# Patient Record
Sex: Female | Born: 1990 | Race: Black or African American | Hispanic: No | Marital: Single | Smoking: Never smoker
Health system: Southern US, Community
[De-identification: ages and names within clinical notes are randomized; demographics above are authoritative.]

## PROBLEM LIST (undated history)

## (undated) ENCOUNTER — Inpatient Hospital Stay (HOSPITAL_COMMUNITY): Payer: Self-pay

## (undated) DIAGNOSIS — Z789 Other specified health status: Secondary | ICD-10-CM

## (undated) HISTORY — PX: TONSILLECTOMY: SUR1361

## (undated) HISTORY — PX: ADENOIDECTOMY: SUR15

---
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
## 2012-01-20 ENCOUNTER — Encounter (HOSPITAL_COMMUNITY): Payer: Self-pay | Admitting: Emergency Medicine

## 2012-01-20 ENCOUNTER — Emergency Department (INDEPENDENT_AMBULATORY_CARE_PROVIDER_SITE_OTHER)
Admission: EM | Admit: 2012-01-20 | Discharge: 2012-01-20 | Disposition: A | Discharge status: Home / Self Care | Payer: Medicaid Other | Source: Home / Self Care

## 2012-01-20 DIAGNOSIS — J069 Acute upper respiratory infection, unspecified: Secondary | ICD-10-CM

## 2012-01-20 DIAGNOSIS — L03019 Cellulitis of unspecified finger: Secondary | ICD-10-CM

## 2012-01-20 DIAGNOSIS — L03011 Cellulitis of right finger: Secondary | ICD-10-CM

## 2012-01-20 MED ORDER — CEPHALEXIN 500 MG PO CAPS: 500.0000 mg | ORAL_CAPSULE | Freq: Four times a day (QID) | ORAL | Status: DC

## 2012-01-20 NOTE — ED Provider Notes (Signed)
History     CSN: [id number]  Arrival date & time 01/20/12  1631   None     Chief Complaint  Patient presents with  . Hand Pain    (Consider location/radiation/quality/duration/timing/severity/associated sxs/prior treatment) HPI Comments: -21 year-old female presents with pain in her right little finger that started 2-3 days ago. She has noticed swelling in the finger however pain tenderness and redness is around the fingernail.  The second complaint is that of upper respiratory infection symptoms. She is complaining of malaise, nasal stuffiness, headache, PND and scratchy throat.    Patient is a 21 y.o. female presenting with hand pain.  Hand Pain    History reviewed. No pertinent past medical history.  Past Surgical History  Procedure Date  . Tonsillectomy     No family history on file.  History  Substance Use Topics  . Smoking status: Passive Smoke Exposure - Never Smoker  . Smokeless tobacco: Not on file  . Alcohol Use: No    OB History    Grav Para Term Preterm Abortions TAB SAB Ect Mult Living                  Review of Systems  Constitutional: Negative for fever, chills, activity change, appetite change and fatigue.  HENT: Positive for congestion, sore throat, rhinorrhea and postnasal drip. Negative for facial swelling, neck pain and neck stiffness.   Eyes: Negative.   Respiratory: Negative.   Cardiovascular: Negative.   Skin: Negative for pallor and rash.  Neurological: Negative.     Allergies  Review of patient's allergies indicates no known allergies.  Home Medications   Current Outpatient Rx  Name Route Sig Dispense Refill  . NAPROXEN PO Oral Take by mouth.    . CEPHALEXIN 500 MG PO CAPS Oral Take 1 capsule (500 mg total) by mouth 4 (four) times daily. X 7 days 28 capsule 0  . PHENYLEPH-DOXYLAMINE-DM-APAP 5-6.25-10-325 MG/15ML PO LIQD Oral Take 30 mLs by mouth every 4 (four) hours as needed. For cold        BP 122/88  Pulse 93  Temp  99.7 F (37.6 C) (Oral)  Resp 12  SpO2 95%  LMP 01/06/2012  Physical Exam  Constitutional: She is oriented to person, place, and time. She appears well-developed and well-nourished. No distress.  HENT:  Right Ear: External ear normal.  Left Ear: External ear normal.  Mouth/Throat: No oropharyngeal exudate.       OP with minor erythema and clear PND  Eyes: Conjunctivae normal and EOM are normal.  Neck: Normal range of motion. Neck supple.  Cardiovascular: Normal rate and regular rhythm.   Pulmonary/Chest: Effort normal and breath sounds normal. No respiratory distress. She has no wheezes.  Musculoskeletal: Normal range of motion. She exhibits no edema.  Lymphadenopathy:    She has no cervical adenopathy.  Neurological: She is alert and oriented to person, place, and time.  Skin: Skin is warm and dry. No rash noted.       Erythema and tenderness along the edge of the nail of the right fifth digit. There is minimal puffiness and does not appear ready or amenable to I&D at this time.  Psychiatric: She has a normal mood and affect.    ED Course  Procedures (including critical care time)  Labs Reviewed - No data to display No results found.   1. Paronychia of fifth finger, right   2. URI (upper respiratory infection)       MDM  Keflex  500 qid for 7 d. Soak R little finger in warm salt water at least tid. For infection worsening return May take Nyquil or Dayquil for URI sx's.  Plenty of fluids. Tylenol q 4h prn .         Hayden Rasmussen, NP 01/20/12 1912

## 2012-01-20 NOTE — ED Notes (Signed)
Patient noticed right little finger swelling, now says finger is turning green.  Visible pus around nail bed at cuticle.  Also c/o cold symptoms: runny nose, stuffy sinus, coughing, sneezing and headache.

## 2012-01-20 NOTE — ED Notes (Signed)
Immunizations are current 

## 2012-01-21 NOTE — ED Provider Notes (Signed)
Medical screening examination/treatment/procedure(s) were performed by resident physician or non-physician practitioner and as supervising physician I was immediately available for consultation/collaboration.   KINDL,JAMES DOUGLAS MD.    James D Kindl, MD 01/21/12 2102 

## 2012-08-30 ENCOUNTER — Encounter (HOSPITAL_COMMUNITY): Payer: Self-pay | Admitting: *Deleted

## 2012-08-30 ENCOUNTER — Inpatient Hospital Stay (HOSPITAL_COMMUNITY)
Admission: AD | Admit: 2012-08-30 | Discharge: 2012-08-31 | Disposition: A | Discharge status: Home / Self Care | Payer: Medicaid Other | Source: Ambulatory Visit | Attending: Obstetrics & Gynecology | Admitting: Obstetrics & Gynecology

## 2012-08-30 DIAGNOSIS — R1032 Left lower quadrant pain: Secondary | ICD-10-CM | POA: Insufficient documentation

## 2012-08-30 DIAGNOSIS — M7918 Myalgia, other site: Secondary | ICD-10-CM

## 2012-08-30 DIAGNOSIS — Z3202 Encounter for pregnancy test, result negative: Secondary | ICD-10-CM | POA: Insufficient documentation

## 2012-08-30 DIAGNOSIS — N912 Amenorrhea, unspecified: Secondary | ICD-10-CM | POA: Insufficient documentation

## 2012-08-30 DIAGNOSIS — N911 Secondary amenorrhea: Secondary | ICD-10-CM

## 2012-08-30 HISTORY — DX: Other specified health status: Z78.9

## 2012-08-30 LAB — URINE MICROSCOPIC-ADD ON

## 2012-08-30 LAB — URINALYSIS, ROUTINE W REFLEX MICROSCOPIC
Bilirubin Urine: NEGATIVE
Ketones, ur: NEGATIVE mg/dL
Nitrite: NEGATIVE
Protein, ur: NEGATIVE mg/dL
pH: 6 (ref 5.0–8.0)

## 2012-08-30 NOTE — MAU Note (Signed)
Pt LMP 07/07/2012, neg UPT at home.  Having cramping x 2 wks greater on left side.

## 2012-08-30 NOTE — MAU Provider Note (Signed)
Chief Complaint: Possible Pregnancy  First Provider Initiated Contact with Patient 08/30/12 2355     SUBJECTIVE HPI: Alice Crawford is a 22 y.o. G0P0 female who presents with left flank pain radiating to left lower quadrant x 2 weeks. LMP 07/07/2012. Negative home UPT. Patient states she usually has monthly cycles. Wants to know if she is pregnant.   Describes pain as constant, but worse when lying down. 5/10 on pain scale at worst. Minimal pain now. Has not tried anything for the pain. Denies fever, chills, urinary complaints, hematuria, vaginal bleeding, vaginal discharge, GI complaints. Patient states she is sexually active, not using birth control, but does not desire pregnancy.  Past Medical History  Diagnosis Date  . Medical history non-contributory    OB History   Grav Para Term Preterm Abortions TAB SAB Ect Mult Living   0              Past Surgical History  Procedure Laterality Date  . Tonsillectomy     History   Social History  . Marital Status: Single    Spouse Name: N/A    Number of Children: N/A  . Years of Education: N/A   Occupational History  . Not on file.   Social History Main Topics  . Smoking status: Current Some Day Smoker    Types: Cigars  . Smokeless tobacco: Not on file  . Alcohol Use: No  . Drug Use: No  . Sexually Active: Yes    Birth Control/ Protection: None   Other Topics Concern  . Not on file   Social History Narrative  . No narrative on file   No current facility-administered medications on file prior to encounter.   Current Outpatient Prescriptions on File Prior to Encounter  Medication Sig Dispense Refill  . Phenyleph-Doxylamine-DM-APAP (TYLENOL COLD MULTI-SYMPTOM) 5-6.25-10-325 MG/15ML LIQD Take 30 mLs by mouth every 4 (four) hours as needed. For cold         No Known Allergies  ROS: Pertinent items in HPI  OBJECTIVE Blood pressure 132/80, pulse 106, temperature 98.2 F (36.8 C), temperature source Oral, resp. rate 18,  height 5\' 8"  (1.727 m), weight 135.716 kg (299 lb 3.2 oz), last menstrual period 07/07/2012.  GENERAL: Well-developed, well-nourished female in no acute distress.  HEENT: Normocephalic HEART: Mild tachycardia. Regular rhythm. RESP: normal effort ABDOMEN: Soft, mild left lower quadrant tenderness. No rebound or mass. BACK: No CVA tenderness. NEURO: Alert and oriented SPECULUM EXAM: NEFG, physiologic discharge, no odor, no blood noted, cervix clean BIMANUAL: cervix closed; uterus normal size, no adnexal tenderness or masses. No CMT.  LAB RESULTS Results for orders placed during the hospital encounter of 08/30/12 (from the past 24 hour(s))  URINALYSIS, ROUTINE W REFLEX MICROSCOPIC     Status: Abnormal   Collection Time    08/30/12 11:07 PM      Result Value Range   Color, Urine YELLOW  YELLOW   APPearance CLEAR  CLEAR   Specific Gravity, Urine 1.015  1.005 - 1.030   pH 6.0  5.0 - 8.0   Glucose, UA NEGATIVE  NEGATIVE mg/dL   Hgb urine dipstick NEGATIVE  NEGATIVE   Bilirubin Urine NEGATIVE  NEGATIVE   Ketones, ur NEGATIVE  NEGATIVE mg/dL   Protein, ur NEGATIVE  NEGATIVE mg/dL   Urobilinogen, UA 1.0  0.0 - 1.0 mg/dL   Nitrite NEGATIVE  NEGATIVE   Leukocytes, UA SMALL (*) NEGATIVE  URINE MICROSCOPIC-ADD ON     Status: Abnormal   Collection Time  08/30/12 11:07 PM      Result Value Range   Squamous Epithelial / LPF FEW (*) RARE   WBC, UA 3-6  <3 WBC/hpf   Bacteria, UA FEW (*) RARE  POCT PREGNANCY, URINE     Status: None   Collection Time    08/30/12 11:25 PM      Result Value Range   Preg Test, Ur NEGATIVE  NEGATIVE  WET PREP, GENITAL     Status: Abnormal   Collection Time    08/31/12 12:40 AM      Result Value Range   Yeast Wet Prep HPF POC NONE SEEN  NONE SEEN   Trich, Wet Prep NONE SEEN  NONE SEEN   Clue Cells Wet Prep HPF POC FEW (*) NONE SEEN   WBC, Wet Prep HPF POC FEW (*) NONE SEEN  CBC     Status: Abnormal   Collection Time    08/31/12 12:55 AM      Result Value  Range   WBC 7.2  4.0 - 10.5 K/uL   RBC 4.99  3.87 - 5.11 MIL/uL   Hemoglobin 11.3 (*) 12.0 - 15.0 g/dL   HCT 14.7 (*) 82.9 - 56.2 %   MCV 69.1 (*) 78.0 - 100.0 fL   MCH 22.6 (*) 26.0 - 34.0 pg   MCHC 32.8  30.0 - 36.0 g/dL   RDW 13.0 (*) 86.5 - 78.4 %   Platelets 393  150 - 400 K/uL    IMAGING No results found.  MAU COURSE  ASSESSMENT 1. Musculoskeletal pain   2. Secondary amenorrhea    PLAN Discharge home in stable condition. Comfort measures. GC/CT pending. Strongly encourage condom use.     Follow-up Information   Follow up with Primary care provider. (As needed if symptoms worsen)       Follow up with Gynecologist. (As needed if you do not get your period in one month.)        Medication List    STOP taking these medications       cephALEXin 500 MG capsule  Commonly known as:  KEFLEX     NAPROXEN PO      TAKE these medications       ibuprofen 600 MG tablet  Commonly known as:  ADVIL,MOTRIN  Take 1 tablet (600 mg total) by mouth every 6 (six) hours as needed for pain.     TYLENOL COLD MULTI-SYMPTOM 5-6.25-10-325 MG/15ML Liqd  Generic drug:  Phenyleph-Doxylamine-DM-APAP  Take 30 mLs by mouth every 4 (four) hours as needed. For cold         [place], [place] 08/31/2012  1:34 AM

## 2012-08-31 DIAGNOSIS — IMO0001 Reserved for inherently not codable concepts without codable children: Secondary | ICD-10-CM

## 2012-08-31 LAB — CBC
Platelets: 393 10*3/uL (ref 150–400)
RBC: 4.99 MIL/uL (ref 3.87–5.11)
WBC: 7.2 10*3/uL (ref 4.0–10.5)

## 2012-08-31 LAB — WET PREP, GENITAL

## 2012-08-31 MED ORDER — IBUPROFEN 600 MG PO TABS: 600.0000 mg | ORAL_TABLET | Freq: Four times a day (QID) | ORAL | Status: DC | PRN

## 2012-08-31 NOTE — MAU Provider Note (Signed)
Attestation of Attending Supervision of Advanced Practitioner (CNM/NP): Evaluation and management procedures were performed by the Advanced Practitioner under my supervision and collaboration. I have reviewed the Advanced Practitioner's note and chart, and I agree with the management and plan.  Yarieliz Wasser H. 5:57 AM   

## 2012-09-01 LAB — URINE CULTURE

## 2012-09-01 LAB — GC/CHLAMYDIA PROBE AMP: CT Probe RNA: NEGATIVE

## 2012-09-02 ENCOUNTER — Encounter (HOSPITAL_COMMUNITY): Payer: Self-pay | Admitting: *Deleted

## 2014-06-20 ENCOUNTER — Emergency Department (HOSPITAL_COMMUNITY)
Admission: EM | Admit: 2014-06-20 | Discharge: 2014-06-20 | Disposition: A | Discharge status: Home / Self Care | Payer: PRIVATE HEALTH INSURANCE | Attending: Emergency Medicine | Admitting: Emergency Medicine

## 2014-06-20 ENCOUNTER — Emergency Department (HOSPITAL_COMMUNITY): Payer: PRIVATE HEALTH INSURANCE

## 2014-06-20 ENCOUNTER — Encounter (HOSPITAL_COMMUNITY): Payer: Self-pay | Admitting: Emergency Medicine

## 2014-06-20 DIAGNOSIS — R55 Syncope and collapse: Secondary | ICD-10-CM | POA: Insufficient documentation

## 2014-06-20 DIAGNOSIS — X58XXXA Exposure to other specified factors, initial encounter: Secondary | ICD-10-CM | POA: Diagnosis not present

## 2014-06-20 DIAGNOSIS — S0990XA Unspecified injury of head, initial encounter: Secondary | ICD-10-CM | POA: Insufficient documentation

## 2014-06-20 DIAGNOSIS — S025XXA Fracture of tooth (traumatic), initial encounter for closed fracture: Secondary | ICD-10-CM | POA: Insufficient documentation

## 2014-06-20 DIAGNOSIS — Y998 Other external cause status: Secondary | ICD-10-CM | POA: Diagnosis not present

## 2014-06-20 DIAGNOSIS — E669 Obesity, unspecified: Secondary | ICD-10-CM | POA: Diagnosis not present

## 2014-06-20 DIAGNOSIS — Y9289 Other specified places as the place of occurrence of the external cause: Secondary | ICD-10-CM | POA: Insufficient documentation

## 2014-06-20 DIAGNOSIS — S59901A Unspecified injury of right elbow, initial encounter: Secondary | ICD-10-CM | POA: Diagnosis not present

## 2014-06-20 DIAGNOSIS — Y9389 Activity, other specified: Secondary | ICD-10-CM | POA: Diagnosis not present

## 2014-06-20 DIAGNOSIS — Z720 Tobacco use: Secondary | ICD-10-CM | POA: Insufficient documentation

## 2014-06-20 LAB — BASIC METABOLIC PANEL
ANION GAP: 10 (ref 5–15)
BUN: 8 mg/dL (ref 6–23)
CO2: 25 mmol/L (ref 19–32)
Calcium: 8.9 mg/dL (ref 8.4–10.5)
Chloride: 103 mmol/L (ref 96–112)
Creatinine, Ser: 0.66 mg/dL (ref 0.50–1.10)
GFR calc Af Amer: >90 mL/min (ref >90)
GLUCOSE: 104 mg/dL — AB (ref 70–99)
Potassium: 3.7 mmol/L (ref 3.5–5.1)
Sodium: 138 mmol/L (ref 135–145)

## 2014-06-20 LAB — POC URINE PREG, ED: PREG TEST UR: NEGATIVE

## 2014-06-20 LAB — CBC
HCT: 35.3 % — ABNORMAL LOW (ref 36.0–46.0)
Hemoglobin: 11.2 g/dL — ABNORMAL LOW (ref 12.0–15.0)
MCH: 22.1 pg — ABNORMAL LOW (ref 26.0–34.0)
MCHC: 31.7 g/dL (ref 30.0–36.0)
MCV: 69.8 fL — ABNORMAL LOW (ref 78.0–100.0)
Platelets: 392 10*3/uL (ref 150–400)
RBC: 5.06 MIL/uL (ref 3.87–5.11)
RDW: 17.2 % — AB (ref 11.5–15.5)
WBC: 6.6 10*3/uL (ref 4.0–10.5)

## 2014-06-20 LAB — CBG MONITORING, ED: GLUCOSE-CAPILLARY: 105 mg/dL — AB (ref 70–99)

## 2014-06-20 MED ORDER — PENICILLIN V POTASSIUM 500 MG PO TABS: 500.0000 mg | ORAL_TABLET | Freq: Three times a day (TID) | ORAL | Status: DC

## 2014-06-20 MED ORDER — OXYCODONE-ACETAMINOPHEN 5-325 MG PO TABS
1.0000 | ORAL_TABLET | Freq: Four times a day (QID) | ORAL | Status: DC | PRN
Start: 2014-06-20 — End: 2015-07-09

## 2014-06-20 MED ORDER — OXYCODONE-ACETAMINOPHEN 5-325 MG PO TABS
1.0000 | ORAL_TABLET | Freq: Once | ORAL | Status: AC
  Administered 2014-06-20: 1 via ORAL
  Filled 2014-06-20: qty 1

## 2014-06-20 MED ORDER — ACETAMINOPHEN 325 MG PO TABS
650.0000 mg | ORAL_TABLET | Freq: Once | ORAL | Status: AC
  Administered 2014-06-20: 650 mg via ORAL
  Filled 2014-06-20: qty 2

## 2014-06-20 NOTE — ED Notes (Signed)
Brought in by EMS from [employer] after her syncopal episode.  Pt reported that she felt dizzy and nauseous while shopping and then passed out; awake on EMS' arrival at the scene.  Pt can not remember falling.  Right elbow pain and headache on return of consciousness.  Pt arrived to ED A/Ox4, c/o right elbow pain and headache; denies dizziness and nausea at this time.  LMP 2 weeks ago.  No significant medical hx.

## 2014-06-20 NOTE — ED Notes (Signed)
Pt was given soda (sprite) for fluid challenge.

## 2014-06-20 NOTE — ED Notes (Signed)
Pt tolerated sprite well--- pt denies nausea.

## 2014-06-20 NOTE — ED Provider Notes (Signed)
CSN: [id number]     Arrival date & time 06/20/14  1618 History   First MD Initiated Contact with Patient 06/20/14 1740     Chief Complaint  Patient presents with  . Loss of Consciousness     (Consider location/radiation/quality/duration/timing/severity/associated sxs/prior Treatment) Patient is a 24 y.o. female presenting with syncope. The history is provided by the patient.  Loss of Consciousness Associated symptoms: headaches and nausea   Associated symptoms: no chest pain, no shortness of breath, no vomiting and no weakness    patient [place] [employer] [place] and [place]. 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 She's had a slight headache since. Some mild pain or elbow 2. States she was somewhat confused why people around her. No chest pain. No trouble breathing. States she was nauseous with for the event. She states all she seems take some crackers. No diarrhea. She denies possibility of pregnancy. No episodes like this before. No chest pain. She occasionally is a smoker.  Past Medical History  Diagnosis Date  . Medical history non-contributory    Past Surgical History  Procedure Laterality Date  . Tonsillectomy     Family History  Problem Relation Age of Onset  . Hypertension Mother    History  Substance Use Topics  . Smoking status: Current Some Day Smoker    Types: Cigars  . Smokeless tobacco: Not on file  . Alcohol Use: No   OB History    Gravida Para Term Preterm AB TAB SAB Ectopic Multiple Living   0              Review of Systems  Constitutional: Negative for activity change and appetite change.  Eyes: Negative for pain.  Respiratory: Negative for chest tightness and shortness of breath.   Cardiovascular: Positive for syncope. Negative for chest pain and leg swelling.  Gastrointestinal: Positive for nausea. Negative for vomiting, abdominal pain and diarrhea.  Genitourinary:  Negative for flank pain.  Musculoskeletal: Negative for back pain and neck stiffness.  Skin: Negative for rash.  Neurological: Positive for syncope and headaches. Negative for weakness and numbness.  Psychiatric/Behavioral: Negative for behavioral problems.      Allergies  Review of patient's allergies indicates no known allergies.  Home Medications   Prior to Admission medications   Medication Sig Start Date End Date Taking? Authorizing Provider  Pseudoeph-Doxylamine-DM-APAP (NYQUIL PO) Take 5 mLs by mouth once.   Yes Historical Provider, MD  ibuprofen (ADVIL,MOTRIN) 600 MG tablet Take 1 tablet (600 mg total) by mouth every 6 (six) hours as needed for pain. Patient not taking: Reported on 06/20/2014 08/31/12   Dorathy KinsmanVirginia Windt, CNM  oxyCODONE-acetaminophen (PERCOCET/ROXICET) 5-325 MG per tablet Take 1-2 tablets by mouth every 6 (six) hours as needed for severe pain. 06/20/14   Benjiman CoreNathan Tyiana Hill, MD  penicillin v potassium (VEETID) 500 MG tablet Take 1 tablet (500 mg total) by mouth 3 (three) times daily. 06/20/14   Benjiman CoreNathan Aleeta Schmaltz, MD   BP 118/74 mmHg  Pulse 80  Temp(Src) 98 F (36.7 C) (Oral)  Resp 18  SpO2 100%  LMP  Physical Exam  Constitutional: She is oriented to person, place, and time. She appears well-developed and well-nourished.  Patient is obese.  HENT:  Head: Normocephalic and atraumatic.  Eyes: EOM are normal. Pupils are equal, round, and reactive to light.  Neck: Normal range of motion. Neck supple.  Cardiovascular: Normal rate, regular rhythm and  normal heart sounds.   No murmur heard. Pulmonary/Chest: Effort normal and breath sounds normal. No respiratory distress. She has no wheezes. She has no rales.  Abdominal: Soft. Bowel sounds are normal. She exhibits no distension. There is no tenderness. There is no rebound and no guarding.  Musculoskeletal: Normal range of motion.  No tenderness to right elbow. Range of motion intact. No tenderness to wrist or shoulder.  Skin intact. Strong radial pulse.  Neurological: She is alert and oriented to person, place, and time. No cranial nerve deficit.  Skin: Skin is warm and dry.  Psychiatric: She has a normal mood and affect. Her speech is normal.  Nursing note and vitals reviewed.   ED Course  Procedures (including critical care time) Labs Review Labs Reviewed  CBC - Abnormal; Notable for the following:    Hemoglobin 11.2 (*)    HCT 35.3 (*)    MCV 69.8 (*)    MCH 22.1 (*)    RDW 17.2 (*)    All other components within normal limits  BASIC METABOLIC PANEL - Abnormal; Notable for the following:    Glucose, Bld 104 (*)    All other components within normal limits  CBG MONITORING, ED - Abnormal; Notable for the following:    Glucose-Capillary 105 (*)    All other components within normal limits  POC URINE PREG, ED    Imaging Review Ct Head Wo Contrast  06/20/2014   CLINICAL DATA:  Syncope, headache  EXAM: CT HEAD WITHOUT CONTRAST  TECHNIQUE: Contiguous axial images were obtained from the base of the skull through the vertex without intravenous contrast.  COMPARISON:  07/10/2006  FINDINGS: No evidence of parenchymal hemorrhage or extra-axial fluid collection. No mass lesion, mass effect, or midline shift.  No CT evidence of acute infarction.  Cerebral volume is within normal limits.  No ventriculomegaly.  The visualized paranasal sinuses are essentially clear. The mastoid air cells are unopacified.  Stable mild chronic irregularity of the left medial orbital wall.  No evidence of calvarial fracture.  IMPRESSION: Normal head CT.   Electronically Signed   By: Charline Bills M.D.   On: 06/20/2014 20:42     EKG Interpretation   Date/Time:  Tuesday June 20 2014 16:46:44 EDT Ventricular Rate:  83 PR Interval:  191 QRS Duration: 92 QT Interval:  369 QTC Calculation: 434 R Axis:   17 Text Interpretation:  Sinus rhythm Confirmed by Rubin Payor  MD, Harrold Donath  [id number]) on 06/20/2014 5:51:51 PM      MDM    Final diagnoses:  Vasovagal syncope  Tooth fracture, closed, initial encounter    Patient with syncopal event. Likely vasovagal. EKG and labs reassuring. Not orthostatic. Doubt pulmonary embolism or other severe cause. Will discharge home.  Patient later told me that she had it on a cracker before she went in to [place]. She states she broke her tooth with it. There is a broken tooth in the left upper lateral jaw. No bleeding. Head CT done due to her severe headache. Will discharge home with antibiotics and pain medicines.  Benjiman Core, MD 06/20/14 2202

## 2014-06-20 NOTE — Discharge Instructions (Signed)
Dental Fracture You have a dental fracture or injury. This can mean the tooth is loose, has a chip in the enamel or is broken. If just the outer enamel is chipped, there is a good chance the tooth will not become infected. The only treatment needed may be to smooth off a rough edge. Fractures into the deeper layers (dentin and pulp) cause greater pain and are more likely to become infected. These require you to see a dentist as soon as possible to save the tooth. Loose teeth may need to be wired or bonded with a plastic splint to hold them in place. A paste may be painted on the open area of the broken tooth to reduce the pain. Antibiotics and pain medicine may be prescribed. Choosing a soft or liquid diet and rinsing the mouth out with warm water after meals may be helpful. See your dentist as recommended. Failure to seek care or follow up with a dentist or other specialist as recommended could result in the loss of your tooth, infection, or permanent dental problems. SEEK MEDICAL CARE IF:   You have increased pain not controlled with medicines.  You have swelling around the tooth, in the face or neck.  You have bleeding which starts, continues, or gets worse.  You have a fever. Document Released: 04/24/2004 Document Revised: 06/09/2011 Document Reviewed: 02/06/2009 [facility] Patient Information 2015 [place]. This information is not intended to replace advice given to you by your health care provider. Make sure you discuss any questions you have with your health care provider.  Vasovagal Syncope, Adult Syncope, commonly known as fainting, is a temporary loss of consciousness. It occurs when the blood flow to the brain is reduced. Vasovagal syncope (also called neurocardiogenic syncope) is a fainting spell in which the blood flow to the brain is reduced because of a sudden drop in heart rate and blood pressure. Vasovagal syncope occurs when the brain and the cardiovascular system (blood  vessels) do not adequately communicate and respond to each other. This is the most common cause of fainting. It often occurs in response to fear or some other type of emotional or physical stress. The body has a reaction in which the heart starts beating too slowly or the blood vessels expand, reducing blood pressure. This type of fainting spell is generally considered harmless. However, injuries can occur if a person takes a sudden fall during a fainting spell.  CAUSES  Vasovagal syncope occurs when a person's blood pressure and heart rate decrease suddenly, usually in response to a trigger. Many things and situations can trigger an episode. Some of these include:   Pain.   Fear.   The sight of blood or medical procedures, such as blood being drawn from a vein.   Common activities, such as coughing, swallowing, stretching, or going to the bathroom.   Emotional stress.   Prolonged standing, especially in a warm environment.   Lack of sleep or rest.   Prolonged lack of food.   Prolonged lack of fluids.   Recent illness.  The use of certain drugs that affect blood pressure, such as cocaine, alcohol, marijuana, inhalants, and opiates.  SYMPTOMS  Before the fainting episode, you may:   Feel dizzy or light headed.   Become pale.  Sense that you are going to faint.   Feel like the room is spinning.   Have tunnel vision, only seeing directly in front of you.   Feel sick to your stomach (nauseous).   See spots  or slowly lose vision.   Hear ringing in your ears.   Have a headache.   Feel warm and sweaty.   Feel a sensation of pins and needles. During the fainting spell, you will generally be unconscious for no longer than a couple minutes before waking up and returning to normal. If you get up too quickly before your body can recover, you may faint again. Some twitching or jerky movements may occur during the fainting spell.  DIAGNOSIS  Your caregiver will  ask about your symptoms, take a medical history, and perform a physical exam. Various tests may be done to rule out other causes of fainting. These may include blood tests and tests to check the heart, such as electrocardiography, echocardiography, and possibly an electrophysiology study. When other causes have been ruled out, a test may be done to check the body's response to changes in position (tilt table test). TREATMENT  Most cases of vasovagal syncope do not require treatment. Your caregiver may recommend ways to avoid fainting triggers and may provide home strategies for preventing fainting. If you must be exposed to a possible trigger, you can drink additional fluids to help reduce your chances of having an episode of vasovagal syncope. If you have warning signs of an oncoming episode, you can respond by positioning yourself favorably (lying down). If your fainting spells continue, you may be given medicines to prevent fainting. Some medicines may help make you more resistant to repeated episodes of vasovagal syncope. Special exercises or compression stockings may be recommended. In rare cases, the surgical placement of a pacemaker is considered. HOME CARE INSTRUCTIONS   Learn to identify the warning signs of vasovagal syncope.   Sit or lie down at the first warning sign of a fainting spell. If sitting, put your head down between your legs. If you lie down, swing your legs up in the air to increase blood flow to the brain.   Avoid hot tubs and saunas.  Avoid prolonged standing.  Drink enough fluids to keep your urine clear or pale yellow. Avoid caffeine.  Increase salt in your diet as directed by your caregiver.   If you have to stand for a long time, perform movements such as:   Crossing your legs.   Flexing and stretching your leg muscles.   Squatting.   Moving your legs.   Bending over.   Only take over-the-counter or prescription medicines as directed by your  caregiver. Do not suddenly stop any medicines without asking your caregiver first. SEEK MEDICAL CARE IF:   Your fainting spells continue or happen more frequently in spite of treatment.   You lose consciousness for more than a couple minutes.  You have fainting spells during or after exercising or after being startled.   You have new symptoms that occur with the fainting spells, such as:   Shortness of breath.  Chest pain.   Irregular heartbeat.   You have episodes of twitching or jerky movements that last longer than a few seconds.  You have episodes of twitching or jerky movements without obvious fainting. SEEK IMMEDIATE MEDICAL CARE IF:   You have injuries or bleeding after a fainting spell.   You have episodes of twitching or jerky movements that last longer than 5 minutes.   You have more than one spell of twitching or jerky movements before returning to consciousness after fainting. MAKE SURE YOU:   Understand these instructions.  Will watch your condition.  Will get help right away if you  are not doing well or get worse. Document Released: 03/03/2012 Document Reviewed: 03/03/2012 [facility] Patient Information 2015 [place], [place]. This information is not intended to replace advice given to you by your health care provider. Make sure you discuss any questions you have with your health care provider.
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
## 2015-04-17 ENCOUNTER — Emergency Department (HOSPITAL_COMMUNITY): Payer: 59

## 2015-04-17 ENCOUNTER — Emergency Department (HOSPITAL_COMMUNITY)
Admission: EM | Admit: 2015-04-17 | Discharge: 2015-04-17 | Disposition: A | Discharge status: Home / Self Care | Payer: 59 | Attending: Emergency Medicine | Admitting: Emergency Medicine

## 2015-04-17 ENCOUNTER — Encounter (HOSPITAL_COMMUNITY): Payer: Self-pay | Admitting: Emergency Medicine

## 2015-04-17 DIAGNOSIS — Y92410 Unspecified street and highway as the place of occurrence of the external cause: Secondary | ICD-10-CM | POA: Diagnosis not present

## 2015-04-17 DIAGNOSIS — Z3A18 18 weeks gestation of pregnancy: Secondary | ICD-10-CM | POA: Diagnosis not present

## 2015-04-17 DIAGNOSIS — O99332 Smoking (tobacco) complicating pregnancy, second trimester: Secondary | ICD-10-CM | POA: Diagnosis not present

## 2015-04-17 DIAGNOSIS — S3991XA Unspecified injury of abdomen, initial encounter: Secondary | ICD-10-CM | POA: Diagnosis not present

## 2015-04-17 DIAGNOSIS — Y998 Other external cause status: Secondary | ICD-10-CM | POA: Insufficient documentation

## 2015-04-17 DIAGNOSIS — O9A212 Injury, poisoning and certain other consequences of external causes complicating pregnancy, second trimester: Secondary | ICD-10-CM | POA: Insufficient documentation

## 2015-04-17 DIAGNOSIS — Z79899 Other long term (current) drug therapy: Secondary | ICD-10-CM | POA: Insufficient documentation

## 2015-04-17 DIAGNOSIS — F17210 Nicotine dependence, cigarettes, uncomplicated: Secondary | ICD-10-CM | POA: Insufficient documentation

## 2015-04-17 DIAGNOSIS — Y9389 Activity, other specified: Secondary | ICD-10-CM | POA: Diagnosis not present

## 2015-04-17 DIAGNOSIS — R1031 Right lower quadrant pain: Secondary | ICD-10-CM

## 2015-04-17 LAB — URINALYSIS, ROUTINE W REFLEX MICROSCOPIC
BILIRUBIN URINE: NEGATIVE
GLUCOSE, UA: NEGATIVE mg/dL
HGB URINE DIPSTICK: NEGATIVE
Ketones, ur: NEGATIVE mg/dL
Leukocytes, UA: NEGATIVE
Nitrite: NEGATIVE
PH: 5.5 (ref 5.0–8.0)
Protein, ur: NEGATIVE mg/dL
SPECIFIC GRAVITY, URINE: 1.015 (ref 1.005–1.030)

## 2015-04-17 LAB — CBC
HEMATOCRIT: 31.1 % — AB (ref 36.0–46.0)
Hemoglobin: 10.5 g/dL — ABNORMAL LOW (ref 12.0–15.0)
MCH: 23.6 pg — AB (ref 26.0–34.0)
MCHC: 33.8 g/dL (ref 30.0–36.0)
MCV: 69.9 fL — AB (ref 78.0–100.0)
Platelets: 329 10*3/uL (ref 150–400)
RBC: 4.45 MIL/uL (ref 3.87–5.11)
RDW: 18.5 % — ABNORMAL HIGH (ref 11.5–15.5)
WBC: 6.1 10*3/uL (ref 4.0–10.5)

## 2015-04-17 LAB — COMPREHENSIVE METABOLIC PANEL
ALK PHOS: 67 U/L (ref 38–126)
ALT: 19 U/L (ref 14–54)
AST: 19 U/L (ref 15–41)
Albumin: 3.2 g/dL — ABNORMAL LOW (ref 3.5–5.0)
Anion gap: 12 (ref 5–15)
BUN: 6 mg/dL (ref 6–20)
CO2: 20 mmol/L — AB (ref 22–32)
CREATININE: 0.58 mg/dL (ref 0.44–1.00)
Calcium: 9.2 mg/dL (ref 8.9–10.3)
Chloride: 106 mmol/L (ref 101–111)
Glucose, Bld: 91 mg/dL (ref 65–99)
Potassium: 4.1 mmol/L (ref 3.5–5.1)
Sodium: 138 mmol/L (ref 135–145)
Total Bilirubin: 0.3 mg/dL (ref 0.3–1.2)
Total Protein: 6.7 g/dL (ref 6.5–8.1)

## 2015-04-17 LAB — I-STAT BETA HCG BLOOD, ED (MC, WL, AP ONLY)

## 2015-04-17 LAB — LIPASE, BLOOD: Lipase: 22 U/L (ref 11–51)

## 2015-04-17 MED ORDER — ACETAMINOPHEN 500 MG PO TABS
1000.0000 mg | ORAL_TABLET | Freq: Once | ORAL | Status: AC
  Administered 2015-04-17: 1000 mg via ORAL
  Filled 2015-04-17: qty 2

## 2015-04-17 NOTE — Discharge Instructions (Signed)

## 2015-04-17 NOTE — ED Notes (Signed)
Tried to get a urine sample from patient. Pt stated she is unable to go at this time. Will try again later.
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
## 2015-07-09 ENCOUNTER — Inpatient Hospital Stay (HOSPITAL_COMMUNITY)
Admission: AD | Admit: 2015-07-09 | Discharge: 2015-07-09 | Disposition: A | Discharge status: Home / Self Care | Payer: Medicaid Other | Source: Ambulatory Visit | Attending: Obstetrics & Gynecology | Admitting: Obstetrics & Gynecology

## 2015-07-09 ENCOUNTER — Encounter (HOSPITAL_COMMUNITY): Payer: Self-pay | Admitting: *Deleted

## 2015-07-09 DIAGNOSIS — O9989 Other specified diseases and conditions complicating pregnancy, childbirth and the puerperium: Secondary | ICD-10-CM | POA: Diagnosis not present

## 2015-07-09 DIAGNOSIS — R0789 Other chest pain: Secondary | ICD-10-CM

## 2015-07-09 DIAGNOSIS — O26893 Other specified pregnancy related conditions, third trimester: Secondary | ICD-10-CM | POA: Insufficient documentation

## 2015-07-09 DIAGNOSIS — K219 Gastro-esophageal reflux disease without esophagitis: Secondary | ICD-10-CM | POA: Diagnosis not present

## 2015-07-09 DIAGNOSIS — R102 Pelvic and perineal pain: Secondary | ICD-10-CM | POA: Insufficient documentation

## 2015-07-09 DIAGNOSIS — O99613 Diseases of the digestive system complicating pregnancy, third trimester: Secondary | ICD-10-CM | POA: Diagnosis not present

## 2015-07-09 DIAGNOSIS — R079 Chest pain, unspecified: Secondary | ICD-10-CM | POA: Diagnosis present

## 2015-07-09 DIAGNOSIS — R3911 Hesitancy of micturition: Secondary | ICD-10-CM

## 2015-07-09 DIAGNOSIS — Z3A30 30 weeks gestation of pregnancy: Secondary | ICD-10-CM | POA: Insufficient documentation

## 2015-07-09 LAB — WET PREP, GENITAL
Clue Cells Wet Prep HPF POC: NONE SEEN
Sperm: NONE SEEN
TRICH WET PREP: NONE SEEN
YEAST WET PREP: NONE SEEN

## 2015-07-09 LAB — URINALYSIS, ROUTINE W REFLEX MICROSCOPIC
Bilirubin Urine: NEGATIVE
Glucose, UA: NEGATIVE mg/dL
HGB URINE DIPSTICK: NEGATIVE
Ketones, ur: NEGATIVE mg/dL
Leukocytes, UA: NEGATIVE
NITRITE: NEGATIVE
PROTEIN: NEGATIVE mg/dL
Specific Gravity, Urine: 1.01 (ref 1.005–1.030)
pH: 5.5 (ref 5.0–8.0)

## 2015-07-09 MED ORDER — GI COCKTAIL ~~LOC~~
30.0000 mL | Freq: Once | ORAL | Status: AC
  Administered 2015-07-09: 30 mL via ORAL
  Filled 2015-07-09: qty 30

## 2015-07-09 NOTE — MAU Provider Note (Signed)
Took over care for this patient from Dr Alice Crawford.  S: patient notes that her sensation of SOB has resolved after GI cocktail.  Has felt baby move since being in MAU.  Continues to have mild centrally located CP.  Notes that she has hesitancy with urination.  No hematuria.  No abnormal vaginal discharge or odors.  No LOF, VB.  O: BP 131/80 mmHg  Pulse 87  Temp(Src) 97.5 F (36.4 C) (Oral)  Resp 20  Ht 5\' 7"  (1.702 m)  Wt 305 lb (138.347 kg)  BMI 47.76 kg/m2  SpO2 100% Gen: awake, alert, obese, gravid female, NAD, mother at bedside HEENT: MMM, EOMI GI: gravid, no TTP to abdomen GU: external vaginal mucosa normal appearing, cervix without punctate lesions, no bleeding from os, scant white milky discharge from os, no cervical motion TTP Dilation: Closed Effacement (%): Thick Cervical Position: Posterior Exam by:: GOTTSCHALK MD  Fetal monitoringBaseline: 145 bpm, Variability: Good {> 6 bpm), Accelerations: Non-reactive but appropriate for gestational age (10x10s present) and Decelerations: Absent Uterine activity: uterine irritability  No results found for this or any previous visit (from the past 24 hour(s)).  A/P:  Alice Crawford is a 25 y.o. G1P0 at [id number]  Other chest pain:  EKG with left axis but otherwise NSR.  No evidence of ischemic processes.  No EKG findings consistent with PE.  Given that she has normal O2 saturations, normal RR and benign lung exam, very low suspicion for pathologic process at this time.  SOB and CP moderately relieved by GI cocktail.  I suspect that there is a component of anxiety/ GERD causing her symptoms.  Discussed EKG results and FH monitoring.  Patient seemed reassured by this. - Recommend TUMS prn symptoms.  Consider Zantac if persistent - If indeed anxiety provoked, OB provider could consider starting SSRI to help control symptoms - Strict return precautions reviewed  Vaginal pain:  GC/CT collected.  Wet prep w/ leuks but otherwise unremarkable.   Also sounds like a component of round ligament pain.  Discussed obtaining a belly sling.    Urinary hesitancy:  UA negative for evidence of infection  Patient discharged home in stable condition.  Recommended that she follow up with her OB provider/ PCP in the next week.  Alice M. Nadine CountsGottschalk, DO PGY-2, [facility]  OB fellow attestation:  I have seen and examined this patient; I agree with above documentation in the residents's note.   Alice Crawford is a 25 y.o. G1P0 reporting chest pain and vaginal pain +FM, denies LOF, VB, contractions, vaginal discharge.  PE: BP 123/51 mmHg  Pulse 90  Temp(Src) 97.5 F (36.4 C) (Oral)  Resp 20  Ht 5\' 7"  (1.702 m)  Wt 305 lb (138.347 kg)  BMI 47.76 kg/m2  SpO2 100% Gen: calm comfortable, NAD Resp: normal effort, no distress Abd: gravid  ROS, labs, PMH reviewed NST reactive   Plan: - d/c home with return precautions - Likely GERD is etiology of sx, per above recommend treatment if sx continue  Attending: Elsie LincolnKelly Leggett MD   Federico FlakeKimberly Crawford Alice Glace, MD 11:54 PM

## 2015-07-09 NOTE — MAU Note (Signed)
Pt reports chest pain ( indicates upper mid area) x 24 hours, reports shortness of breath x 1 hour. Also reports no fetal movement x 2 days. States she is having vaginal discomfort.

## 2015-07-09 NOTE — Discharge Instructions (Signed)
Consider purchasing a prenatal cradle band like this one: [email or web address]/ref=sr_1_2_a_it?ie=UTF8&qid=704-137-7105&sr=8-2&keywords=prenatal+cradle.  This will help with round ligament pain (pain you feel in your vagina and lower pelvis).  You can take TUMS for heartburn.  Follow up with your OB or PCP in the next week.    Round Ligament Pain The round ligament is a cord of muscle and tissue that helps to support the uterus. It can become a source of pain during pregnancy if it becomes stretched or twisted as the baby grows. The pain usually begins in the second trimester of pregnancy, and it can come and go until the baby is delivered. It is not a serious problem, and it does not cause harm to the baby. Round ligament pain is usually a short, sharp, and pinching pain, but it can also be a dull, lingering, and aching pain. The pain is felt in the lower side of the abdomen or in the groin. It usually starts deep in the groin and moves up to the outside of the hip area. Pain can occur with:  A sudden change in position.  Rolling over in bed.  Coughing or sneezing.  Physical activity. HOME CARE INSTRUCTIONS Watch your condition for any changes. Take these steps to help with your pain:  When the pain starts, relax. Then try:  Sitting down.  Flexing your knees up to your abdomen.  Lying on your side with one pillow under your abdomen and another pillow between your legs.  Sitting in a warm bath for 15-20 minutes or until the pain goes away.  Take over-the-counter and prescription medicines only as told by your health care provider.  Move slowly when you sit and stand.  Avoid long walks if they cause pain.  Stop or lessen your physical activities if they cause pain. SEEK MEDICAL CARE IF:  Your pain does not go away with treatment.  You feel pain in your back that you did not have before.  Your medicine is not helping. SEEK IMMEDIATE  MEDICAL CARE IF:  You develop a fever or chills.  You develop uterine contractions.  You develop vaginal bleeding.  You develop nausea or vomiting.  You develop diarrhea.  You have pain when you urinate.   This information is not intended to replace advice given to you by your health care provider. Make sure you discuss any questions you have with your health care provider.   Document Released: 12/25/2007 Document Revised: 06/09/2011 Document Reviewed: 05/24/2014 Else[employer] Interactive Patient Education [employer]

## 2015-07-09 NOTE — MAU Provider Note (Signed)
History     CSN: [id number]  Arrival date and time: 07/09/15 1907   None     No chief complaint on file.  HPI  She has been having constant chest pain since yesterday. Located in the center of her chest. The pain "feels like something is tightening up". The pain does not radiate. Nothing makes the pain better. The pain is worse with deep inspiration, bending forward, and laying down. She has not tried any medications. She has never had pain like this before. The chest pain is associated with shortness of breath. The chest pain is not associated with diaphoresis or nausea.  Vaginal pain that started today. The pain is "sharp". The pain is worse with movement. Nothing makes the pain better. She denies any vaginal discharge, no vaginal itchiness. She endorses dysuria that started this morning. She also endorses urinary frequency and urgency. No fevers, no chills.  She denies any vaginal bleeding. No gush of fluids. She has felt fetal movement.   Past Medical History  Diagnosis Date  . Medical history non-contributory     Past Surgical History  Procedure Laterality Date  . Tonsillectomy    . Adenoidectomy      Family History  Problem Relation Age of Onset  . Hypertension Mother     Social History  Substance Use Topics  . Smoking status: Never Smoker   . Smokeless tobacco: None  . Alcohol Use: No    Allergies: No Known Allergies  Prescriptions prior to admission  Medication Sig Dispense Refill Last Dose  . acetaminophen (TYLENOL) 325 MG tablet Take 325 mg by mouth every 6 (six) hours as needed for headache.   Past Week at Unknown time  . ibuprofen (ADVIL,MOTRIN) 600 MG tablet Take 1 tablet (600 mg total) by mouth every 6 (six) hours as needed for pain. (Patient not taking: Reported on 06/20/2014) 30 tablet 1 Not Taking at Unknown time  . oxyCODONE-acetaminophen (PERCOCET/ROXICET) 5-325 MG per tablet Take 1-2 tablets by mouth every 6 (six) hours as needed for severe pain.  (Patient not taking: Reported on 04/17/2015) 10 tablet 0 Not Taking at Unknown time  . penicillin v potassium (VEETID) 500 MG tablet Take 1 tablet (500 mg total) by mouth 3 (three) times daily. (Patient not taking: Reported on 04/17/2015) 21 tablet 0 Completed Course at Unknown time  . Prenatal Vit-Fe Fumarate-FA (PRENATAL MULTIVITAMIN) TABS tablet Take 1 tablet by mouth daily at 12 noon.   04/17/2015 at Unknown time    Review of Systems  Constitutional: Negative for fever and chills.  Eyes: Negative for blurred vision.  Respiratory: Negative for shortness of breath.   Cardiovascular: Positive for chest pain.  Gastrointestinal: Negative for heartburn, nausea, vomiting and abdominal pain.  Genitourinary: Negative for dysuria.  Musculoskeletal: Negative for myalgias.  Skin: Negative for rash.  Neurological: Negative for dizziness, weakness and headaches.  Endo/Heme/Allergies: Negative for environmental allergies.   Physical Exam   Blood pressure 131/80, pulse 87, temperature 97.5 F (36.4 C), temperature source Oral, resp. rate 20, height  (1.702 m), weight 305 lb (138.347 kg), SpO2 100 %.  Physical Exam  Nursing note and vitals reviewed. Constitutional: She is oriented to person, place, and time. She appears well-developed and well-nourished. No distress.  Eyes: No scleral icterus.  Neck: Normal range of motion.  Cardiovascular: Normal rate.   Tenderness to palpation of the sternum  Respiratory: Effort normal.  GI: Soft. There is no tenderness.  gravid  Musculoskeletal: Normal range of motion.  Neurological: She is alert and oriented to person, place, and time.  Skin: Skin is warm and dry. No rash noted.    MAU Course  Procedures  MDM Pt is having chest pain that is most likely musculoskeletal in origin, given that her pain is reproducible on palpation. She could also have GERD, although she denies any burning in her chest and cough. Her EKG does not show any ST elevation,  so cardiac etiology is less likely.  Assessment and Plan  #Chest Pain: Likely musculoskeletal vs GERD - EKG ordered and did not show any ST elevations or depressions - Will order a GI cocktail to see if this helps her pain  #Vaginal Pain: Likely UTI vs STD vs yeast vs pressure related to pregnancy - UA pending - Will order a wet prep and GC/Chl  Care transferred to oncoming physician, Alice FlavinAshly Gottschalk, DO.   Alice Crawford 07/09/2015, 7:42 PM   OB fellow attestation:  I have seen and examined this patient; I agree with above documentation in the resident's note.   Alice Crawford is a 25 y.o. G1P0 reporting chest pain and vaginal pain +FM, denies LOF, VB, contractions, vaginal discharge.  PE: BP 123/51 mmHg  Pulse 90  Temp(Src) 97.5 F (36.4 C) (Oral)  Resp 20  Ht 5\' 7"  (1.702 m)  Wt 305 lb (138.347 kg)  BMI 47.76 kg/m2  SpO2 100% Gen: calm comfortable, NAD Resp: normal effort, no distress Abd: gravid  ROS, labs, PMH reviewed NST reactive   Plan: - Ongoing care - Ekg wnl - trial of GI cocktail for sx  Alice FlakeKimberly Niles [name], MD 11:53 PM

## 2015-07-10 LAB — GC/CHLAMYDIA PROBE AMP (~~LOC~~) NOT AT ARMC
CHLAMYDIA, DNA PROBE: NEGATIVE
NEISSERIA GONORRHEA: NEGATIVE

## 2016-05-13 ENCOUNTER — Encounter (HOSPITAL_COMMUNITY): Payer: Self-pay
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
## 2019-01-13 DIAGNOSIS — Z23 Encounter for immunization: Secondary | ICD-10-CM | POA: Diagnosis not present

## 2019-04-13 ENCOUNTER — Ambulatory Visit: Payer: Medicaid Other | Attending: Internal Medicine

## 2019-04-13 DIAGNOSIS — Z20822 Contact with and (suspected) exposure to covid-19: Secondary | ICD-10-CM | POA: Diagnosis not present

## 2019-04-15 LAB — NOVEL CORONAVIRUS, NAA: SARS-CoV-2, NAA: NOT DETECTED

## 2019-05-11 DIAGNOSIS — F411 Generalized anxiety disorder: Secondary | ICD-10-CM | POA: Diagnosis not present

## 2019-05-11 DIAGNOSIS — F321 Major depressive disorder, single episode, moderate: Secondary | ICD-10-CM | POA: Diagnosis not present

## 2019-05-17 DIAGNOSIS — F411 Generalized anxiety disorder: Secondary | ICD-10-CM | POA: Diagnosis not present

## 2019-05-17 DIAGNOSIS — F321 Major depressive disorder, single episode, moderate: Secondary | ICD-10-CM | POA: Diagnosis not present

## 2019-07-18 DIAGNOSIS — R519 Headache, unspecified: Secondary | ICD-10-CM | POA: Diagnosis not present

## 2019-07-18 DIAGNOSIS — Z713 Dietary counseling and surveillance: Secondary | ICD-10-CM | POA: Diagnosis not present

## 2019-09-19 ENCOUNTER — Other Ambulatory Visit: Payer: Self-pay

## 2019-09-19 ENCOUNTER — Emergency Department (HOSPITAL_COMMUNITY)
Admission: EM | Admit: 2019-09-19 | Discharge: 2019-09-19 | Disposition: A | Discharge status: Home / Self Care | Payer: Medicaid Other | Attending: Emergency Medicine | Admitting: Emergency Medicine

## 2019-09-19 ENCOUNTER — Encounter (HOSPITAL_COMMUNITY): Payer: Self-pay | Admitting: Emergency Medicine

## 2019-09-19 DIAGNOSIS — K047 Periapical abscess without sinus: Secondary | ICD-10-CM | POA: Insufficient documentation

## 2019-09-19 DIAGNOSIS — K0889 Other specified disorders of teeth and supporting structures: Secondary | ICD-10-CM

## 2019-09-19 MED ORDER — IBUPROFEN 800 MG PO TABS: 800.0000 mg | ORAL_TABLET | Freq: Three times a day (TID) | ORAL | 0 refills | Status: AC

## 2019-09-19 MED ORDER — AMOXICILLIN-POT CLAVULANATE 875-125 MG PO TABS: 1.0000 | ORAL_TABLET | Freq: Two times a day (BID) | ORAL | 0 refills | Status: AC

## 2019-09-19 MED ORDER — KETOROLAC TROMETHAMINE 15 MG/ML IJ SOLN
15.0000 mg | Freq: Once | INTRAMUSCULAR | Status: AC
  Administered 2019-09-19: 15 mg via INTRAMUSCULAR
  Filled 2019-09-19: qty 1

## 2019-09-19 NOTE — Discharge Instructions (Signed)
I have prescribed a short course of antibiotics in order to help with your dental infection, please take 1 tablet twice a day for the next 7 days.  I have also prescribed medication to help with your pain, please take this as needed.  You will ultimately need to make an appointment with a dentist.

## 2019-09-19 NOTE — ED Provider Notes (Signed)
[facility] Provider Note   CSN: [id number] Arrival date & time: 09/19/19  0556     History Chief Complaint  Patient presents with  . Dental Pain    Alice Crawford is a 29 y.o. female.  29 y.o female with no PMH presents to the ED with a chief complaint of dental pain x1 week.  Patient describes a sharp sensation to the left posterior aspect of her gumline, reports this pain felt similar in January however this later resolved.  States that she has had work done on her tooth several years ago where she had a chipped tooth, she attempted to have this worked on however states that anesthesia did not work and there was pieces that were left.  She reports pain along the area, worse with talking, mastication.  She is had some increase in swelling to her face along with headaches.  Has been taken Motrin without improvement in symptoms.  No fever, no difficulty tolerating her secretions, no other complaints.  The history is provided by the patient and medical records.       Past Medical History:  Diagnosis Date  . Medical history non-contributory     There are no problems to display for this patient.   Past Surgical History:  Procedure Laterality Date  . ADENOIDECTOMY    . TONSILLECTOMY       OB History    Gravida  1   Para      Term      Preterm      AB      Living        SAB      TAB      Ectopic      Multiple      Live Births              Family History  Problem Relation Age of Onset  . Hypertension Mother     Social History   Tobacco Use  . Smoking status: Never Smoker  . Smokeless tobacco: Never Used  Substance Use Topics  . Alcohol use: No  . Drug use: No    Home Medications Prior to Admission medications   Medication Sig Start Date End Date Taking? Authorizing Provider  acetaminophen (TYLENOL) 500 MG tablet Take 1,000 mg by mouth every 6 (six) hours as needed for mild pain.    [provider]  amoxicillin-clavulanate (AUGMENTIN) 875-125 MG tablet Take 1 tablet by mouth 2 (two) times daily for 7 days. 09/19/19 09/26/19  Janeece Fitting, PA-C  Prenatal Vit-Fe Fumarate-FA (PRENATAL MULTIVITAMIN) TABS tablet Take 1 tablet by mouth daily at 12 noon.    [provider]    Allergies    Patient has no known allergies.  Review of Systems   Review of Systems  Constitutional: Negative for fever.  HENT: Positive for dental problem.     Physical Exam Updated Vital Signs BP 131/65 (BP Location: Right Arm)   Pulse 78   Temp 97.6 F (36.4 C) (Oral)   Resp 16   Ht 5\' 7"  (1.702 m)   Wt (!) 140 kg   LMP 09/13/2019   SpO2 100%   BMI 48.34 kg/m   Physical Exam Vitals and nursing note reviewed.  Constitutional:      Appearance: Normal appearance.  HENT:     Head: Normocephalic and atraumatic.     Nose: Nose normal.     Mouth/Throat:     Mouth: Mucous membranes  are moist.     Dentition: Dental tenderness and dental abscesses present.     Pharynx: Oropharynx is clear. Uvula midline. No oropharyngeal exudate or posterior oropharyngeal erythema.     Comments: Periapical abscess noted to left second molar. Mild surrounding erythema. Uvula is midline, no erythema or tonsillar exudate noted.  Eyes:     Pupils: Pupils are equal, round, and reactive to light.  Cardiovascular:     Rate and Rhythm: Normal rate.  Pulmonary:     Effort: Pulmonary effort is normal.  Abdominal:     General: Abdomen is flat.  Musculoskeletal:     Cervical back: Normal range of motion and neck supple.  Skin:    General: Skin is warm and dry.  Neurological:     Mental Status: She is alert and oriented to person, place, and time.     ED Results / Procedures / Treatments   Labs (all labs ordered are listed, but only abnormal results are displayed) Labs Reviewed - No data to display  EKG None  Radiology No results found.  Procedures Procedures (including critical care  time)  Medications Ordered in ED Medications  ketorolac (TORADOL) 15 MG/ML injection 15 mg (15 mg Intramuscular Given 09/19/19 1240)    ED Course  I have reviewed the triage vital signs and the nursing notes.  Pertinent labs & imaging results that were available during my care of the patient were reviewed by me and considered in my medical decision making (see chart for details).    MDM Rules/Calculators/A&P    Patient with no pertinent past medical history presents to the ED with complaints of dental abscess for the past week.  States dental pain has been present since January, had chipped tooth fixed a couple of years ago, and states anesthesia did not work there for the left part of the tooth present.  During my evaluation there is a periapical abscess, surrounding erythema, the rest of her oropharynx is clear, no other dental caries is abscesses noted.  There is slight facial swelling noted however she is tolerating her secretions.  There has been no fever at home.  Over-the-counter medication such as motrin has been taken.  She was provided with IM Toradol during her visit to the ED.  She is encouraged to follow-up with dentist in order to obtain further management of her dental problem.  She will go home on a short course of Augmentin to help with dental abscess.  Advised to complete medication.  Patient understands agrees to management, discharged in stable condition.   Portions of this note were generated with Scientist, clinical (histocompatibility and immunogenetics). Dictation errors may occur despite best attempts at proofreading.  Final Clinical Impression(s) / ED Diagnoses Final diagnoses:  Pain, dental  Dental abscess    Rx / DC Orders ED Discharge Orders         Ordered    amoxicillin-clavulanate (AUGMENTIN) 875-125 MG tablet  2 times daily     Discontinue  Reprint     09/19/19 1255           Claude Manges, PA-C 09/19/19 1257    Pollyann Savoy, MD 09/19/19 1451

## 2019-09-19 NOTE — ED Triage Notes (Signed)
Patient reports pain at left side of mouth with mild swelling onset this week , denies injury , no fever or chills .

## 2019-09-19 NOTE — ED Notes (Signed)
All appropriate discharge materials reviewed at length with patient. Time for questions provided. Pt has no other questions at this time and verbalizes understanding of all provided materials.  

## 2019-11-25 ENCOUNTER — Inpatient Hospital Stay (HOSPITAL_COMMUNITY): Payer: Medicaid Other

## 2019-11-25 ENCOUNTER — Other Ambulatory Visit: Payer: Self-pay

## 2019-11-25 ENCOUNTER — Inpatient Hospital Stay (HOSPITAL_COMMUNITY)
Admission: AD | Admit: 2019-11-25 | Discharge: 2019-11-25 | Disposition: A | Discharge status: Home / Self Care | Payer: Medicaid Other | Attending: Obstetrics & Gynecology | Admitting: Obstetrics & Gynecology

## 2019-11-25 ENCOUNTER — Encounter (HOSPITAL_COMMUNITY): Payer: Self-pay | Admitting: Obstetrics & Gynecology

## 2019-11-25 DIAGNOSIS — O200 Threatened abortion: Secondary | ICD-10-CM

## 2019-11-25 DIAGNOSIS — A5900 Urogenital trichomoniasis, unspecified: Secondary | ICD-10-CM | POA: Insufficient documentation

## 2019-11-25 DIAGNOSIS — O209 Hemorrhage in early pregnancy, unspecified: Secondary | ICD-10-CM | POA: Diagnosis not present

## 2019-11-25 DIAGNOSIS — N83202 Unspecified ovarian cyst, left side: Secondary | ICD-10-CM | POA: Diagnosis not present

## 2019-11-25 DIAGNOSIS — Z3A01 Less than 8 weeks gestation of pregnancy: Secondary | ICD-10-CM | POA: Insufficient documentation

## 2019-11-25 DIAGNOSIS — Z79899 Other long term (current) drug therapy: Secondary | ICD-10-CM | POA: Insufficient documentation

## 2019-11-25 DIAGNOSIS — O3481 Maternal care for other abnormalities of pelvic organs, first trimester: Secondary | ICD-10-CM | POA: Insufficient documentation

## 2019-11-25 DIAGNOSIS — A599 Trichomoniasis, unspecified: Secondary | ICD-10-CM

## 2019-11-25 DIAGNOSIS — O98311 Other infections with a predominantly sexual mode of transmission complicating pregnancy, first trimester: Secondary | ICD-10-CM | POA: Insufficient documentation

## 2019-11-25 DIAGNOSIS — O4690 Antepartum hemorrhage, unspecified, unspecified trimester: Secondary | ICD-10-CM

## 2019-11-25 DIAGNOSIS — Z3201 Encounter for pregnancy test, result positive: Secondary | ICD-10-CM | POA: Diagnosis not present

## 2019-11-25 LAB — POCT PREGNANCY, URINE: Preg Test, Ur: POSITIVE — AB

## 2019-11-25 LAB — CBC
HCT: 33.8 % — ABNORMAL LOW (ref 36.0–46.0)
Hemoglobin: 10.5 g/dL — ABNORMAL LOW (ref 12.0–15.0)
MCH: 21.1 pg — ABNORMAL LOW (ref 26.0–34.0)
MCHC: 31.1 g/dL (ref 30.0–36.0)
MCV: 68 fL — ABNORMAL LOW (ref 80.0–100.0)
Platelets: 380 10*3/uL (ref 150–400)
RBC: 4.97 MIL/uL (ref 3.87–5.11)
RDW: 19.5 % — ABNORMAL HIGH (ref 11.5–15.5)
WBC: 6.6 10*3/uL (ref 4.0–10.5)
nRBC: 0 % (ref 0.0–0.2)

## 2019-11-25 LAB — URINALYSIS, ROUTINE W REFLEX MICROSCOPIC
Bacteria, UA: NONE SEEN
Bilirubin Urine: NEGATIVE
Glucose, UA: NEGATIVE mg/dL
Ketones, ur: NEGATIVE mg/dL
Nitrite: NEGATIVE
Protein, ur: NEGATIVE mg/dL
RBC / HPF: >50 RBC/hpf — ABNORMAL HIGH (ref 0–5)
Specific Gravity, Urine: 1.014 (ref 1.005–1.030)
pH: 6 (ref 5.0–8.0)

## 2019-11-25 LAB — WET PREP, GENITAL
Clue Cells Wet Prep HPF POC: NONE SEEN
Sperm: NONE SEEN
WBC, Wet Prep HPF POC: NONE SEEN
Yeast Wet Prep HPF POC: NONE SEEN

## 2019-11-25 LAB — HCG, QUANTITATIVE, PREGNANCY: hCG, Beta Chain, Quant, S: 2912 m[IU]/mL — ABNORMAL HIGH (ref <5)

## 2019-11-25 LAB — ABO/RH: ABO/RH(D): O POS

## 2019-11-25 MED ORDER — METRONIDAZOLE 500 MG PO TABS
2000.0000 mg | ORAL_TABLET | Freq: Once | ORAL | Status: AC
  Administered 2019-11-25: 2000 mg via ORAL
  Filled 2019-11-25: qty 4

## 2019-11-25 NOTE — Discharge Instructions (Signed)
Trichomoniasis Trichomoniasis is an STI (sexually transmitted infection) that can affect both women and men. In women, the outer area of the female genitalia (vulva) and the vagina are affected. In men, mainly the penis is affected, but the prostate and other reproductive organs can also be involved.  This condition can be treated with medicine. It often has no symptoms (is asymptomatic), especially in men. If not treated, trichomoniasis can last for months or years. What are the causes? This condition is caused by a parasite called Trichomonas vaginalis. Trichomoniasis most often spreads from person to person (is contagious) through sexual contact. What increases the risk? The following factors may make you more likely to develop this condition:  Having unprotected sex.  Having sex with a partner who has trichomoniasis.  Having multiple sexual partners.  Having had previous trichomoniasis infections or other STIs. What are the signs or symptoms? In women, symptoms of trichomoniasis include:  Abnormal vaginal discharge that is clear, white, gray, or yellow-green and foamy and has an unusual "fishy" odor.  Itching and irritation of the vagina and vulva.  Burning or pain during urination or sex.  Redness and swelling of the genitals. In men, symptoms of trichomoniasis include:  Penile discharge that may be foamy or contain pus.  Pain in the penis. This may happen only when urinating.  Itching or irritation inside the penis.  Burning after urination or ejaculation. How is this diagnosed? In women, this condition may be found during a routine Pap test or physical exam. It may be found in men during a routine physical exam. Your health care provider may do tests to help diagnose this infection, such as:  Urine tests (men and women).  The following in women: ? Testing the pH of the vagina. ? A vaginal swab test that checks for the Trichomonas vaginalis parasite. ? Testing vaginal  secretions. Your health care provider may test you for other STIs, including HIV (human immunodeficiency virus). How is this treated? This condition is treated with medicine taken by mouth (orally), such as metronidazole or tinidazole, to fight the infection. Your sexual partner(s) also need to be tested and treated.  If you are a woman and you plan to become pregnant or think you may be pregnant, tell your health care provider right away. Some medicines that are used to treat the infection should not be taken during pregnancy. Your health care provider may recommend over-the-counter medicines or creams to help relieve itching or irritation. You may be tested for infection again 3 months after treatment. Follow these instructions at home:  Take and use over-the-counter and prescription medicines, including creams, only as told by your health care provider.  Take your antibiotic medicine as told by your health care provider. Do not stop taking the antibiotic even if you start to feel better.  Do not have sex until 7-10 days after you finish your medicine, or until your health care provider approves. Ask your health care provider when you may start to have sex again.  (Women) Do not douche or wear tampons while you have the infection.  Discuss your infection with your sexual partner(s). Make sure that your partner gets tested and treated, if necessary.  Keep all follow-up visits as told by your health care provider. This is important. How is this prevented?   Use condoms every time you have sex. Using condoms correctly and consistently can help protect against STIs.  Avoid having multiple sexual partners.  Talk with your sexual partner about any   symptoms that either of you may have, as well as any history of STIs.  Get tested for STIs and STDs (sexually transmitted diseases) before you have sex. Ask your partner to do the same.  Do not have sexual contact if you have symptoms of  trichomoniasis or another STI. Contact a health care provider if:  You still have symptoms after you finish your medicine.  You develop pain in your abdomen.  You have pain when you urinate.  You have bleeding after sex.  You develop a rash.  You feel nauseous or you vomit.  You plan to become pregnant or think you may be pregnant. Summary  Trichomoniasis is an STI (sexually transmitted infection) that can affect both women and men.  This condition often has no symptoms (is asymptomatic), especially in men.  Without treatment, this condition can last for months or years.  You should not have sex until 7-10 days after you finish your medicine, or until your health care provider approves. Ask your health care provider when you may start to have sex again.  Discuss your infection with your sexual partner(s). Make sure that your partner gets tested and treated, if necessary. This information is not intended to replace advice given to you by your health care provider. Make sure you discuss any questions you have with your health care provider. Document Revised: 12/29/2017 Document Reviewed: 12/29/2017 Elsevier Patient Education  2020 [employer]  

## 2019-11-25 NOTE — MAU Note (Signed)
Pt reports positive home preg test on 08/19. States yesterday she started having some spotting. Today it is like a period. Changing multiple pads per hours. Some off/on cramping.

## 2019-11-25 NOTE — MAU Provider Note (Signed)
History     CSN: [id number]  Arrival date and time: 11/25/19 2036   First Provider Initiated Contact with Patient 11/25/19 2134      Chief Complaint  Patient presents with   Possible Pregnancy   Vaginal Bleeding   Alice Crawford is a 29 y.o. G2P1001 at [redacted]w[redacted]d by Definite LMP of October 10, 2019 with positive UPT on August 19th.  She presents today for Possible Pregnancy and Vaginal Bleeding.  She reports she started having spotting yesterday and today around 1630 she started having heavy bleeding "like I was having a cycle."  She endorses passing clots the size of a blueberry. She reports some intermittent cramping, but does not endorse pain.      OB History    Gravida  2   Para  1   Term  1   Preterm      AB      Living  1     SAB      TAB      Ectopic      Multiple      Live Births              Past Medical History:  Diagnosis Date   Medical history non-contributory     Past Surgical History:  Procedure Laterality Date   ADENOIDECTOMY      Family History  Problem Relation Age of Onset   Hypertension Mother     Social History   Tobacco Use   Smoking status: Never Smoker   Smokeless tobacco: Never Used  Vaping Use   Vaping Use: Never used  Substance Use Topics   Alcohol use: No   Drug use: No    Allergies: No Known Allergies  Medications Prior to Admission  Medication Sig Dispense Refill Last Dose   Prenatal Vit-Fe Fumarate-FA (PRENATAL MULTIVITAMIN) TABS tablet Take 1 tablet by mouth daily at 12 noon.   11/25/2019 at Unknown time   acetaminophen (TYLENOL) 500 MG tablet Take 1,000 mg by mouth every 6 (six) hours as needed for mild pain.   Unknown at Unknown time    Review of Systems  Gastrointestinal: Negative for abdominal pain, constipation, diarrhea, nausea and vomiting.  Genitourinary: Positive for vaginal bleeding. Negative for difficulty urinating, dysuria, pelvic pain and vaginal discharge.  Musculoskeletal: Negative  for back pain.  Neurological: Negative for dizziness, light-headedness and headaches.   Physical Exam   Blood pressure 109/78, pulse 87, temperature 98.4 F (36.9 C), temperature source Oral, resp. rate 17, height 5\' 7"  (1.702 m), weight 127.9 kg, last menstrual period 10/10/2019, SpO2 100 %, unknown if currently breastfeeding.  Physical Exam Exam conducted with a chaperone present.  Constitutional:      General: She is not in acute distress.    Appearance: Normal appearance.  HENT:     Head: Normocephalic and atraumatic.  Eyes:     Conjunctiva/sclera: Conjunctivae normal.  Cardiovascular:     Rate and Rhythm: Normal rate and regular rhythm.     Heart sounds: Normal heart sounds.  Pulmonary:     Effort: Pulmonary effort is normal.     Breath sounds: Normal breath sounds.  Abdominal:     General: Bowel sounds are normal.     Tenderness: There is abdominal tenderness in the epigastric area and periumbilical area.  Genitourinary:    General: Normal vulva.     Vagina: Bleeding present.     Cervix: Cervical bleeding present. No cervical motion tenderness, discharge, friability or erythema.  Uterus: Not enlarged.      Comments: Speculum Exam: -Normal External Genitalia: Non tender, Blood noted and removed at introitus.  -Vaginal Vault: Pink mucosa with good rugae. Small amt blood removed with faux swab x 2 -wet prep collected -Cervix:Pink, no lesions, cysts, or polyps.  Appears closed. Scant amt active bleeding from os-GC/CT collected -Bimanual Exam:  No tenderness. No apparent uterine enlargement.   Musculoskeletal:        General: Normal range of motion.     Cervical back: Normal range of motion.  Skin:    General: Skin is warm and dry.  Neurological:     Mental Status: She is alert and oriented to person, place, and time.  Psychiatric:        Mood and Affect: Mood normal.        Behavior: Behavior normal.        Thought Content: Thought content normal.     MAU  Course  Procedures Results for orders placed or performed during the hospital encounter of 11/25/19 (from the past 24 hour(s))  Pregnancy, urine POC     Status: Abnormal   Collection Time: 11/25/19  9:02 PM  Result Value Ref Range   Preg Test, Ur POSITIVE (A) NEGATIVE  Urinalysis, Routine w reflex microscopic Urine, Clean Catch     Status: Abnormal   Collection Time: 11/25/19  9:03 PM  Result Value Ref Range   Color, Urine YELLOW YELLOW   APPearance HAZY (A) CLEAR   Specific Gravity, Urine 1.014 1.005 - 1.030   pH 6.0 5.0 - 8.0   Glucose, UA NEGATIVE NEGATIVE mg/dL   Hgb urine dipstick LARGE (A) NEGATIVE   Bilirubin Urine NEGATIVE NEGATIVE   Ketones, ur NEGATIVE NEGATIVE mg/dL   Protein, ur NEGATIVE NEGATIVE mg/dL   Nitrite NEGATIVE NEGATIVE   Leukocytes,Ua SMALL (A) NEGATIVE   RBC / HPF >50 (H) 0 - 5 RBC/hpf   WBC, UA 0-5 0 - 5 WBC/hpf   Bacteria, UA NONE SEEN NONE SEEN  ABO/Rh     Status: None   Collection Time: 11/25/19  9:21 PM  Result Value Ref Range   ABO/RH(D) O POS    No rh immune globuloin      NOT A RH IMMUNE GLOBULIN CANDIDATE, PT RH POSITIVE Performed at [facility], [address] [place], [place] [address]   CBC     Status: Abnormal   Collection Time: 11/25/19  9:29 PM  Result Value Ref Range   WBC 6.6 4.0 - 10.5 K/uL   RBC 4.97 3.87 - 5.11 MIL/uL   Hemoglobin 10.5 (L) 12.0 - 15.0 g/dL   HCT 24.0 (L) 36 - 46 %   MCV 68.0 (L) 80.0 - 100.0 fL   MCH 21.1 (L) 26.0 - 34.0 pg   MCHC 31.1 30.0 - 36.0 g/dL   RDW 97.3 (H) 53.2 - 99.2 %   Platelets 380 150 - 400 K/uL   nRBC 0.0 0.0 - 0.2 %  hCG, quantitative, pregnancy     Status: Abnormal   Collection Time: 11/25/19  9:29 PM  Result Value Ref Range   hCG, Beta Chain, Quant, S 2,912 (H) <5 mIU/mL  Wet prep, genital     Status: Abnormal   Collection Time: 11/25/19  9:45 PM   Specimen: PATH Cytology Cervicovaginal Ancillary Only  Result Value Ref Range   Yeast Wet Prep HPF POC NONE SEEN NONE SEEN    Trich, Wet Prep PRESENT (A) NONE SEEN   Clue  Cells Wet Prep HPF POC NONE SEEN NONE SEEN   WBC, Wet Prep HPF POC NONE SEEN NONE SEEN   Sperm NONE SEEN    US OB LESS THAN 14 WEEKS WITH OB TRANSVAGINAL  Result Date: 11/25/2019 CLINICAL DATA:  Pelvic pain and bleeding, positive pregnancy test EXAM: OBSTETRIC <14 WK [place] AND TRANSVAGINAL OB US TECHNIQUE: Both transabdominal and transvaginal ultrasound examinations were performed for complete evaluation of the gestation as well as the maternal uterus, adnexal regions, and pelvic cul-de-sac. Transvaginal technique was performed to assess early pregnancy. COMPARISON:  None. FINDINGS: Intrauterine gestational sac: Tiny Yolk sac:  Absent Embryo:  Absent MSD: 5.6 mm   5 w   2 d Subchorionic hemorrhage:  None visualized. Maternal uterus/adnexae: 3.1 cm cyst is noted within the left ovary. Right ovary is within normal limits. IMPRESSION: Probable early intrauterine gestational sac, but no yolk sac, fetal pole, or cardiac activity yet visualized. Recommend follow-up quantitative B-HCG levels and follow-up US in 14 days to assess viability. This recommendation follows SRU consensus guidelines: Diagnostic Criteria for Nonviable Pregnancy Early in the First Trimester. Malva Limes Med 2013; 242:3536-14. Electronically Signed   By: Alcide Clever M.D.   On: 11/25/2019 22:20    MDM Pelvic Exam; Wet Prep and GC/CT Labs: UA, UPT, CBC, hCG, ABO Ultrasound Assessment and Plan  29 year old G2P1001 at 6.4weeks Vaginal Bleeding  -Reviewed POC with patient. -Exam performed and findings discussed.  -Cultures collected and pending. -Labs ordered and collected. -Will send for [place] and await results.   Cherre Robins 11/25/2019, 9:34 PM   Reassessment (11:31 PM) IUGS w/o YS or FP Trichomoniasis  -Results as above -Provider to bedside to discuss with patient. -Informed of need for follow up labs in 48-52 hours. -Appt scheduled for 0845 at [facility] for Stat Quant.  -Informed  of trichomoniasis diagnosis and treatment.  -Given Flagyl 2 grams now. Patient declines EPT -Patient without questions or concerns. -Encouraged to call or return to MAU if symptoms worsen or with the onset of new symptoms. -Discharged to home in stable condition.  Cherre Robins MSN, CNM Advanced Practice Provider, [facility] [employer]

## 2019-11-26 ENCOUNTER — Inpatient Hospital Stay (HOSPITAL_COMMUNITY)
Admission: AD | Admit: 2019-11-26 | Discharge: 2019-11-26 | Disposition: A | Discharge status: Home / Self Care | Payer: Medicaid Other | Attending: Family Medicine | Admitting: Family Medicine

## 2019-11-26 ENCOUNTER — Other Ambulatory Visit: Payer: Self-pay

## 2019-11-26 DIAGNOSIS — R109 Unspecified abdominal pain: Secondary | ICD-10-CM | POA: Diagnosis not present

## 2019-11-26 DIAGNOSIS — O209 Hemorrhage in early pregnancy, unspecified: Secondary | ICD-10-CM | POA: Insufficient documentation

## 2019-11-26 DIAGNOSIS — O26891 Other specified pregnancy related conditions, first trimester: Secondary | ICD-10-CM | POA: Diagnosis not present

## 2019-11-26 DIAGNOSIS — Z09 Encounter for follow-up examination after completed treatment for conditions other than malignant neoplasm: Secondary | ICD-10-CM

## 2019-11-26 DIAGNOSIS — Z3A01 Less than 8 weeks gestation of pregnancy: Secondary | ICD-10-CM | POA: Diagnosis not present

## 2019-11-26 NOTE — MAU Note (Signed)
Pt reports she has been bleeding since yesterday.  Pt reports she is still passing clots that are slightly bigger than yesterday.  Pt reports lower abd cramping that she rates 3/10.

## 2019-11-26 NOTE — MAU Provider Note (Signed)
First Provider Initiated Contact with Patient 11/26/19 1236      Alice Crawford is a 29 y.o. G2P1001 at [redacted]w[redacted]d who presents with vaignal bleeding and cramping. She was seen in MAU at 2130 for the same complaints. She states she has passed a few more clots but the bleeding and pain is not worse than last night. Denies any dizziness or light headedness   O BP 108/66   Pulse 84   Temp 98.9 F (37.2 C) (Oral)   Resp 16   LMP 10/10/2019   SpO2 100%  Physical Exam Vitals and nursing note reviewed.  Constitutional:      General: She is not in acute distress.    Appearance: She is well-developed.  HENT:     Head: Normocephalic.  Eyes:     Pupils: Pupils are equal, round, and reactive to light.  Cardiovascular:     Rate and Rhythm: Normal rate and regular rhythm.     Heart sounds: Normal heart sounds.  Pulmonary:     Effort: Pulmonary effort is normal. No respiratory distress.     Breath sounds: Normal breath sounds.  Abdominal:     General: Bowel sounds are normal. There is no distension.     Palpations: Abdomen is soft.     Tenderness: There is no abdominal tenderness.  Skin:    General: Skin is warm and dry.  Neurological:     Mental Status: She is alert and oriented to person, place, and time.  Psychiatric:        Behavior: Behavior normal.        Thought Content: Thought content normal.        Judgment: Judgment normal.     A 1. Follow up    Medical screening exam complete  Discussed with patient normalcy of symptoms she is experiencing in this situation and reassurance provided. Discussed with patient that redoing blood work at this time would not give more answers and reassured patient that scheduled appointment is appropriate time. Patient tearful and verbalized understanding.    P Discharge from MAU in stable condition List of options for follow-up given  Warning signs for worsening condition that would warrant emergency follow-up discussed Patient may  return to MAU as needed for pregnancy related complaints  Rolm Bookbinder, CNM 11/26/2019 12:36 PM

## 2019-11-28 ENCOUNTER — Ambulatory Visit: Payer: Medicaid Other

## 2019-11-28 ENCOUNTER — Other Ambulatory Visit: Payer: Self-pay

## 2019-11-28 DIAGNOSIS — O4690 Antepartum hemorrhage, unspecified, unspecified trimester: Secondary | ICD-10-CM | POA: Diagnosis not present

## 2019-11-28 DIAGNOSIS — O0281 Inappropriate change in quantitative human chorionic gonadotropin (hCG) in early pregnancy: Secondary | ICD-10-CM | POA: Diagnosis not present

## 2019-11-28 LAB — GC/CHLAMYDIA PROBE AMP (~~LOC~~) NOT AT ARMC
Chlamydia: NEGATIVE
Comment: NEGATIVE
Comment: NORMAL
Neisseria Gonorrhea: NEGATIVE

## 2019-11-28 LAB — BETA HCG QUANT (REF LAB): hCG Quant: 322 m[IU]/mL

## 2019-11-28 NOTE — Progress Notes (Signed)
Patient was assessed and managed by nursing staff during this encounter. I have reviewed the chart and agree with the documentation and plan. I have also made any necessary editorial changes.  Patient contacted by phone and informed of quant HCG 322 (previously over 2000 on 8/27). Discussed with patient that findings are consistent with a miscarriage. Patient to return weekly for quant HCG  Catalina Antigua, MD 11/28/2019 11:07 AM

## 2019-11-28 NOTE — Progress Notes (Signed)
Pt is in the office for STAT hcg. Pt reports still bleeding today with some cramping similar to menstrual cycle. Pt states she changes pad about every hour. Pt denies fever.

## 2019-12-06 ENCOUNTER — Other Ambulatory Visit: Payer: Medicaid Other

## 2019-12-09 ENCOUNTER — Other Ambulatory Visit: Payer: Medicaid Other

## 2019-12-21 ENCOUNTER — Other Ambulatory Visit: Payer: Medicaid Other

## 2019-12-21 ENCOUNTER — Other Ambulatory Visit: Payer: Self-pay

## 2019-12-21 DIAGNOSIS — O4690 Antepartum hemorrhage, unspecified, unspecified trimester: Secondary | ICD-10-CM

## 2019-12-21 DIAGNOSIS — O0281 Inappropriate change in quantitative human chorionic gonadotropin (hCG) in early pregnancy: Secondary | ICD-10-CM | POA: Diagnosis not present

## 2019-12-21 NOTE — Progress Notes (Unsigned)
HCG order 

## 2019-12-22 LAB — BETA HCG QUANT (REF LAB): hCG Quant: <1 m[IU]/mL

## 2020-01-04 ENCOUNTER — Other Ambulatory Visit: Payer: Medicaid Other

## 2020-01-04 DIAGNOSIS — R059 Cough, unspecified: Secondary | ICD-10-CM | POA: Diagnosis not present

## 2020-06-06 ENCOUNTER — Other Ambulatory Visit: Payer: Self-pay

## 2020-06-06 ENCOUNTER — Ambulatory Visit
Admission: RE | Admit: 2020-06-06 | Discharge: 2020-06-06 | Disposition: A | Discharge status: Home / Self Care | Payer: Medicaid Other | Source: Ambulatory Visit | Attending: Emergency Medicine | Admitting: Emergency Medicine

## 2020-06-06 VITALS — BP 136/86 | HR 85 | Temp 99.1°F | Resp 18

## 2020-06-06 DIAGNOSIS — Z113 Encounter for screening for infections with a predominantly sexual mode of transmission: Secondary | ICD-10-CM | POA: Diagnosis not present

## 2020-06-06 LAB — POCT URINE PREGNANCY: Preg Test, Ur: NEGATIVE

## 2020-06-06 NOTE — ED Provider Notes (Signed)
[facility]    CSN: [id number] Arrival date & time: 06/06/20  0846      History   Chief Complaint No chief complaint on file. STD screening  HPI Icy Fuhrmann Stay is a 30 y.o. female  presenting today for STD screening.  Patient reports that she would like to be screened for STDs.  Denies any symptoms of abdominal pain, discharge.  Denies any urinary symptoms of dysuria, increased frequency or urgency.  Denies any rashes or lesions.  Denies any new partners.  Last menstrual cycle was 05/30/2020, not on birth control.  Reports this most recent cycle did have some spotting after ending which is abnormal for her.  HPI  Past Medical History:  Diagnosis Date  . Medical history non-contributory     There are no problems to display for this patient.   Past Surgical History:  Procedure Laterality Date  . ADENOIDECTOMY      OB History    Gravida  2   Para  1   Term  1   Preterm      AB      Living  1     SAB      IAB      Ectopic      Multiple      Live Births               Home Medications    Prior to Admission medications   Not on File    Family History Family History  Problem Relation Age of Onset  . Hypertension Mother     Social History Social History   Tobacco Use  . Smoking status: Never Smoker  . Smokeless tobacco: Never Used  Vaping Use  . Vaping Use: Never used  Substance Use Topics  . Alcohol use: No  . Drug use: No     Allergies   Patient has no known allergies.   Review of Systems Review of Systems  Constitutional: Negative for fever.  Respiratory: Negative for shortness of breath.   Cardiovascular: Negative for chest pain.  Gastrointestinal: Negative for abdominal pain, diarrhea, nausea and vomiting.  Genitourinary: Negative for dysuria, flank pain, genital sores, hematuria, menstrual problem, vaginal bleeding, vaginal discharge and vaginal pain.  Musculoskeletal: Negative for back pain.  Skin:  Negative for rash.  Neurological: Negative for dizziness, light-headedness and headaches.     Physical Exam Triage Vital Signs ED Triage Vitals  Enc Vitals Group     BP      Pulse      Resp      Temp      Temp src      SpO2      Weight      Height      Head Circumference      Peak Flow      Pain Score      Pain Loc      Pain Edu?      Excl. in GC?    No data found.  Updated Vital Signs BP 136/86 (BP Location: Left Arm)   Pulse 85   Temp 99.1 F (37.3 C) (Oral)   Resp 18   LMP 05/30/2020   SpO2 98%   Breastfeeding Unknown   Visual Acuity Right Eye Distance:   Left Eye Distance:   Bilateral Distance:    Right Eye Near:   Left Eye Near:    Bilateral Near:     Physical Exam Vitals and nursing note reviewed.  Constitutional:      Appearance: She is well-developed and well-nourished.     Comments: No acute distress  HENT:     Head: Normocephalic and atraumatic.     Nose: Nose normal.  Eyes:     Conjunctiva/sclera: Conjunctivae normal.  Cardiovascular:     Rate and Rhythm: Normal rate.  Pulmonary:     Effort: Pulmonary effort is normal. No respiratory distress.  Abdominal:     General: There is no distension.  Musculoskeletal:        General: Normal range of motion.     Cervical back: Neck supple.  Skin:    General: Skin is warm and dry.  Neurological:     Mental Status: She is alert and oriented to person, place, and time.  Psychiatric:        Mood and Affect: Mood and affect normal.      UC Treatments / Results  Labs (all labs ordered are listed, but only abnormal results are displayed) Labs Reviewed  POCT URINE PREGNANCY  CERVICOVAGINAL ANCILLARY ONLY    EKG   Radiology No results found.  Procedures Procedures (including critical care time)  Medications Ordered in UC Medications - No data to display  Initial Impression / Assessment and Plan / UC Course  I have reviewed the triage vital signs and the nursing notes.  Pertinent  labs & imaging results that were available during my care of the patient were reviewed by me and considered in my medical decision making (see chart for details).     STD screening-vaginal swab pending for GC/chlamydia/trichomonas, deferring HIV and syphilis.  We will call with results and provide treatment as needed.  Pregnancy test negative.  Discussed strict return precautions. Patient verbalized understanding and is agreeable with plan.  Final Clinical Impressions(s) / UC Diagnoses   Final diagnoses:  Screen for STD (sexually transmitted disease)     Discharge Instructions      We are testing you for Gonorrhea, Chlamydia, Trichomonas, Yeast and Bacterial Vaginosis. We will call you if anything is positive and let you know if you require any further treatment. Please inform partners of any positive results.   Please return if symptoms not improving with treatment, development of fever, nausea, vomiting, abdominal pain.     ED Prescriptions    None     PDMP not reviewed this encounter.   Jacque Garrels, [place] C, PA-C 06/06/20 0930

## 2020-06-06 NOTE — ED Triage Notes (Signed)
Pt states that she would like to be tested for std. Denies any sx.

## 2020-06-06 NOTE — Discharge Instructions (Signed)
  We are testing you for Gonorrhea, Chlamydia, Trichomonas, Yeast and Bacterial Vaginosis. We will call you if anything is positive and let you know if you require any further treatment. Please inform partners of any positive results.   Please return if symptoms not improving with treatment, development of fever, nausea, vomiting, abdominal pain.  

## 2020-06-07 ENCOUNTER — Telehealth: Payer: Self-pay | Admitting: Emergency Medicine

## 2020-06-07 LAB — CERVICOVAGINAL ANCILLARY ONLY
Bacterial Vaginitis (gardnerella): POSITIVE — AB
Candida Glabrata: NEGATIVE
Candida Vaginitis: NEGATIVE
Chlamydia: NEGATIVE
Comment: NEGATIVE
Comment: NEGATIVE
Comment: NEGATIVE
Comment: NEGATIVE
Comment: NEGATIVE
Comment: NORMAL
Neisseria Gonorrhea: NEGATIVE
Trichomonas: NEGATIVE

## 2020-06-07 MED ORDER — METRONIDAZOLE 500 MG PO TABS: 500.0000 mg | ORAL_TABLET | Freq: Two times a day (BID) | ORAL | 0 refills | Status: AC

## 2020-06-07 NOTE — Telephone Encounter (Signed)
Contacted patient and notified her of lab results. Per protocol, Flagyl 500mg  BID x 7 days sent it to [facility] on [address] [facility] per patient. Verified with patient that she is not pregnant and LMP was 05/30/2020. Patient verbalized understanding and had no further questions or concerns.

## 2020-09-28 ENCOUNTER — Encounter: Payer: Self-pay | Admitting: Emergency Medicine

## 2020-09-28 ENCOUNTER — Other Ambulatory Visit: Payer: Self-pay

## 2020-09-28 ENCOUNTER — Ambulatory Visit
Admission: EM | Admit: 2020-09-28 | Discharge: 2020-09-28 | Disposition: A | Discharge status: Home / Self Care | Payer: Medicaid Other | Attending: Family Medicine | Admitting: Family Medicine

## 2020-09-28 DIAGNOSIS — K0889 Other specified disorders of teeth and supporting structures: Secondary | ICD-10-CM

## 2020-09-28 DIAGNOSIS — K047 Periapical abscess without sinus: Secondary | ICD-10-CM | POA: Diagnosis not present

## 2020-09-28 MED ORDER — AMOXICILLIN-POT CLAVULANATE 875-125 MG PO TABS: 1.0000 | ORAL_TABLET | Freq: Two times a day (BID) | ORAL | 0 refills | Status: AC

## 2020-09-28 MED ORDER — HYDROCODONE-ACETAMINOPHEN 5-325 MG PO TABS
1.0000 | ORAL_TABLET | Freq: Four times a day (QID) | ORAL | 0 refills | Status: AC | PRN
Start: 2020-09-28 — End: 2020-10-01

## 2020-09-28 NOTE — Discharge Instructions (Addendum)
I have sent in Augmentin for you to take twice a day for 7 days. ° °I have sent in Norco for you to take for pain, one tablet every 6 hours as needed for moderate to severe pain ° °Follow up with this office or with primary care if symptoms are persisting. ° °Follow up in the ER for high fever, trouble swallowing, trouble breathing, other concerning symptoms. ° °

## 2020-09-28 NOTE — ED Triage Notes (Signed)
Patient c/o dental pain x 2 days.   Patient denies fever and mouth swelling.   Patient endorses LFT sided lower dental pain.   Patient states she had a procedure where the crack tooth couldn't be removed (2020).   Patient endorses increased pain with jaw movement.   Patient has tried OTC medications with no relief of symptoms.

## 2020-09-28 NOTE — ED Provider Notes (Signed)
[facility]   [id number] 09/28/20 Arrival Time: 1522  CC: DENTAL pain  SUBJECTIVE:  Alice Crawford is a 30 y.o. female who presents with dental pain x 4 days. Denies a precipitating event or trauma. Localizes pain to left upper jaw. Has tried OTC analgesics without relief. Worse with chewing. Does not see a dentist regularly. Denies similar symptoms in the past. Denies fever, chills, dysphagia, odynophagia, nausea, vomiting, chest pain, SOB.    ROS: As per HPI.  All other pertinent ROS negative.     Past Medical History:  Diagnosis Date   Medical history non-contributory    Past Surgical History:  Procedure Laterality Date   ADENOIDECTOMY     No Known Allergies No current facility-administered medications on file prior to encounter.   No current outpatient medications on file prior to encounter.   Social History   Socioeconomic History   Marital status: Single    Spouse name: Not on file   Number of children: Not on file   Years of education: Not on file   Highest education level: Not on file  Occupational History   Not on file  Tobacco Use   Smoking status: Never   Smokeless tobacco: Never  Vaping Use   Vaping Use: Never used  Substance and Sexual Activity   Alcohol use: No   Drug use: No   Sexual activity: Yes    Birth control/protection: None  Other Topics Concern   Not on file  Social History Narrative   Not on file   Social Determinants of Health   Financial Resource Strain: Not on file  Food Insecurity: Not on file  Transportation Needs: Not on file  Physical Activity: Not on file  Stress: Not on file  Social Connections: Not on file  Intimate Partner Violence: Not on file   Family History  Problem Relation Age of Onset   Hypertension Mother     OBJECTIVE:  Vitals:   09/28/20 1712  BP: (!) 134/92  Pulse: 93  Resp: 14  Temp: 98.3 F (36.8 C)  TempSrc: Oral  SpO2: 98%    General appearance: alert; no distress HENT:  normocephalic; atraumatic; dentition: good; poor and gingival hypertrophy over left upper gums without areas of fluctuance Neck: supple without LAD Lungs: normal respirations Skin: warm and dry Psychological: alert and cooperative; normal mood and affect  ASSESSMENT & PLAN:  1. Dental infection   2. Pain, dental     Meds ordered this encounter  Medications   amoxicillin-clavulanate (AUGMENTIN) 875-125 MG tablet    Sig: Take 1 tablet by mouth 2 (two) times daily for 7 days.    Dispense:  14 tablet    Refill:  0    Order Specific Question:   Supervising Provider    Answer:   Merrilee Jansky [id number]   HYDROcodone-acetaminophen (NORCO/VICODIN) 5-325 MG tablet    Sig: Take 1 tablet by mouth every 6 (six) hours as needed for up to 3 days for moderate pain or severe pain.    Dispense:  10 tablet    Refill:  0    Order Specific Question:   Supervising Provider    Answer:   Merrilee Jansky [[id number]]   Augmentin prescribed Prescribed Norco 1 tablet every 6 hours as needed for breakthrough pain May continue ibuprofen Ibuprofen prescribed.  Use as directed for pain relief Recommend soft diet until evaluated by dentist Maintain oral hygiene care Follow up with dentist as soon as possible for further evaluation  and treatment  Dental resource guide attached Return or go to the ED if you have any new or worsening symptoms such as fever, chills, difficulty swallowing, painful swallowing, oral or neck swelling, nausea, vomiting, chest pain, SOB.  Reviewed expectations re: course of current medical issues. Questions answered. Outlined signs and symptoms indicating need for more acute intervention. Patient verbalized understanding. After Visit Summary given.    Moshe Cipro, NP 09/28/20 1719

## 2021-04-22 IMAGING — US US OB < 14 WEEKS - US OB TV
1 series · 15 of 28 positions shown · non-contrast
Comparison: None.

CLINICAL DATA: Pelvic pain and bleeding, positive pregnancy test

EXAM:
OBSTETRIC <14 WK US AND TRANSVAGINAL OB US
TECHNIQUE: Both transabdominal and transvaginal ultrasound examinations were
performed for complete evaluation of the gestation as well as the
maternal uterus, adnexal regions, and pelvic cul-de-sac.
Transvaginal technique was performed to assess early pregnancy.

[Series 1: us ob < 14 weeks - us ob tv · 15 of 55 slices shown]
[im 1/55]
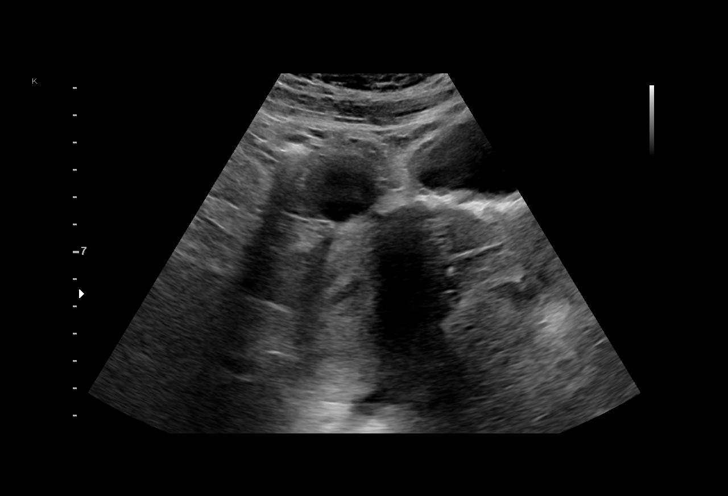
[im 5/55]
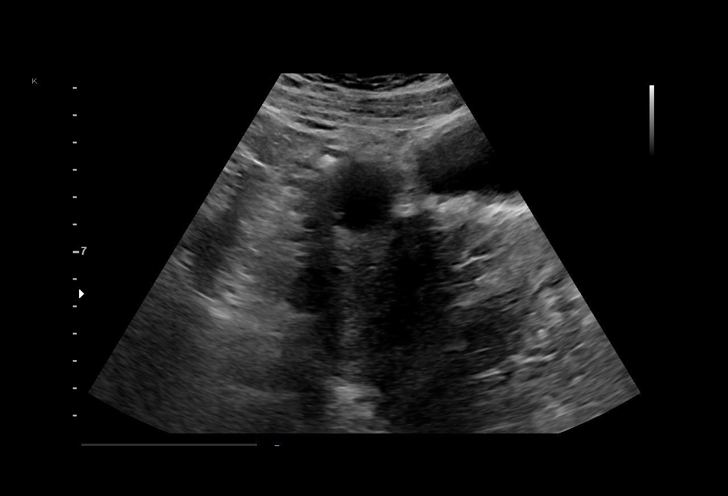
[im 9/55]
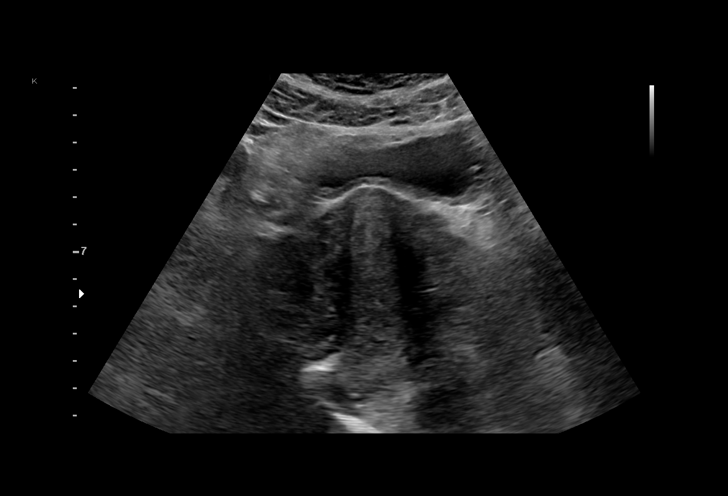
[im 13/55]
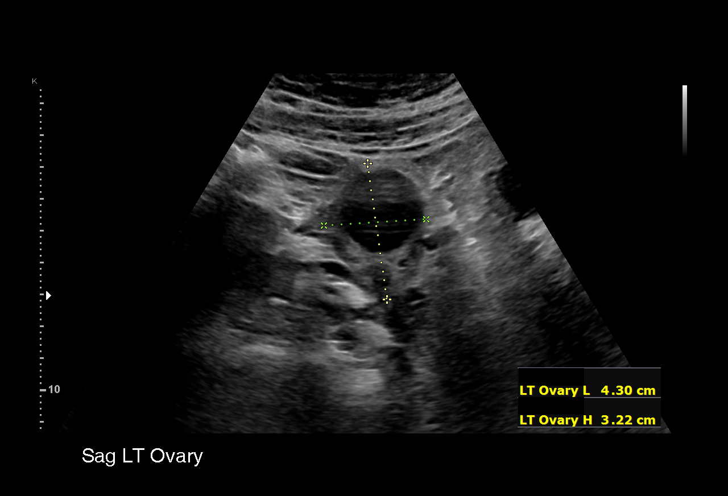
[im 17/55]
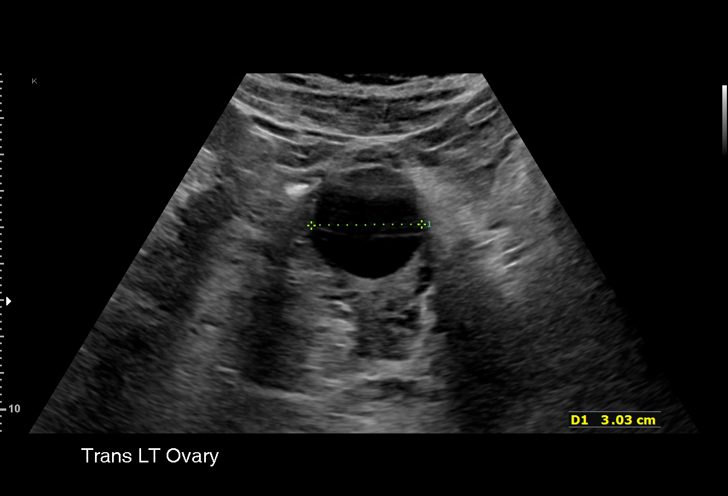
[im 21/55]
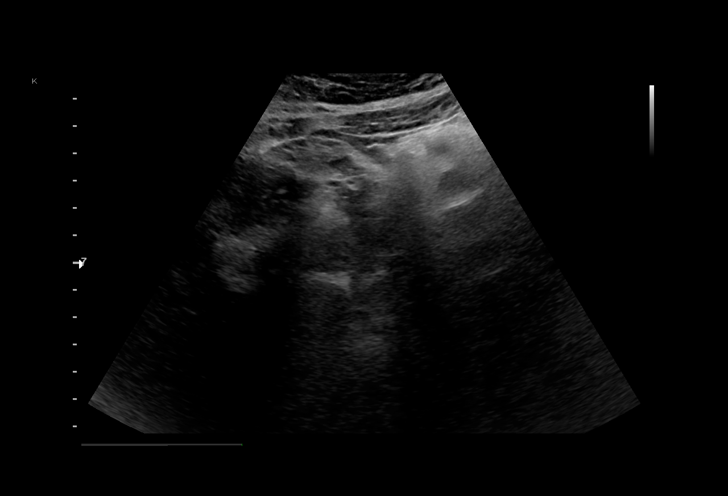
[im 25/55]
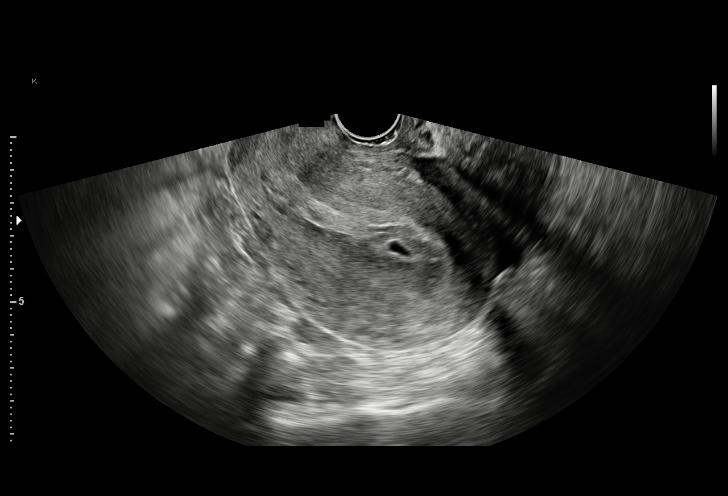
[im 29/55]
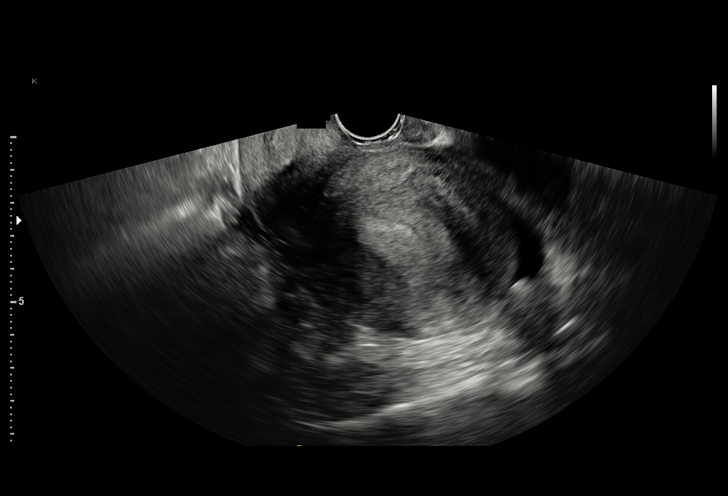
[im 31/55]
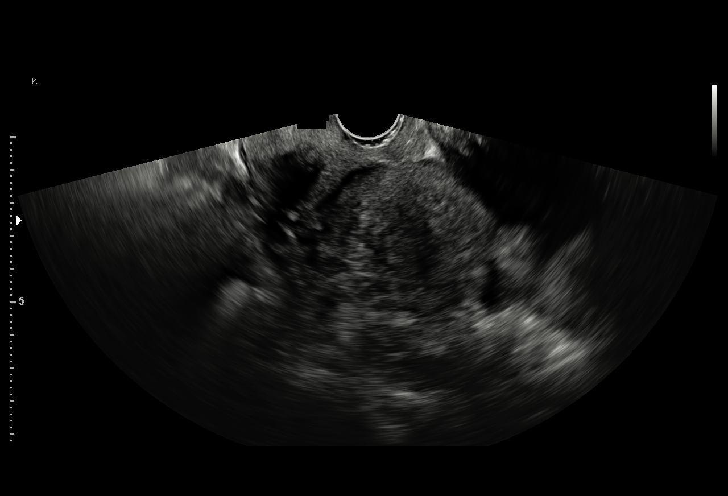
[im 35/55]
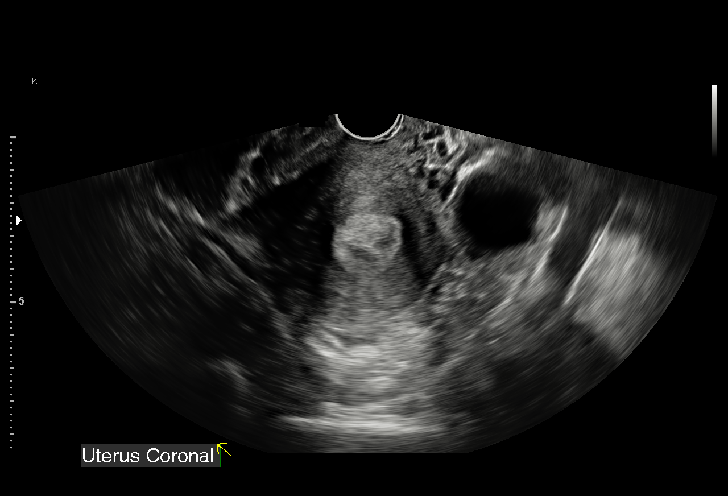
[im 39/55]
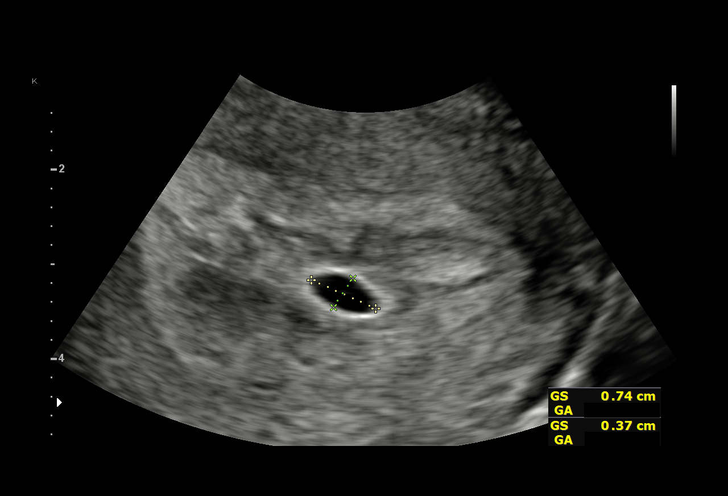
[im 43/55]
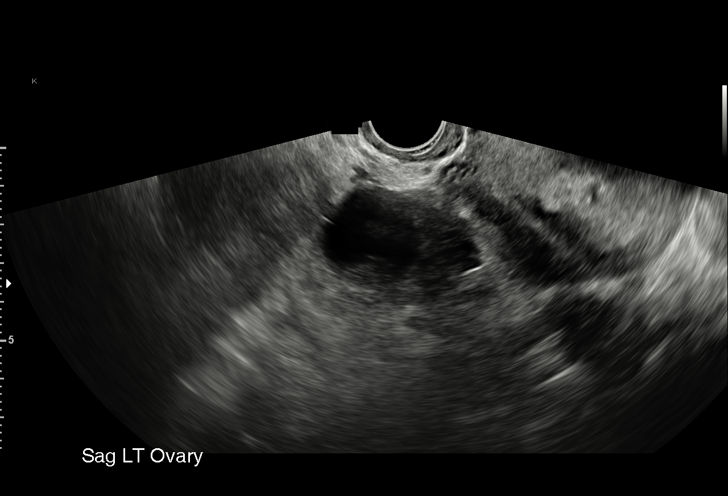
[im 47/55]
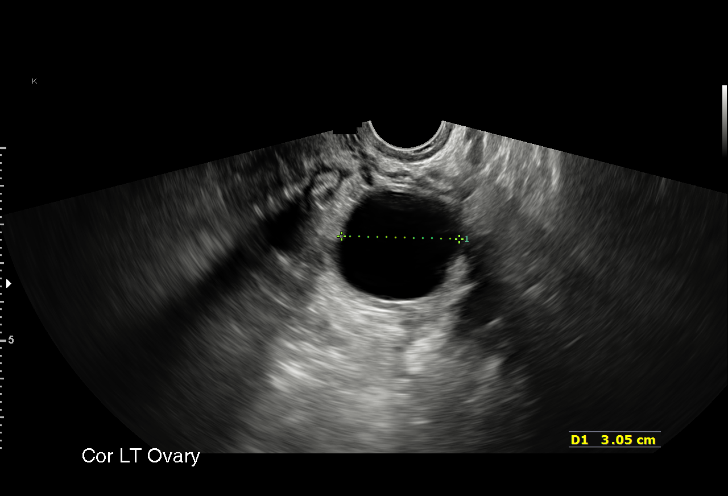
[im 51/55]
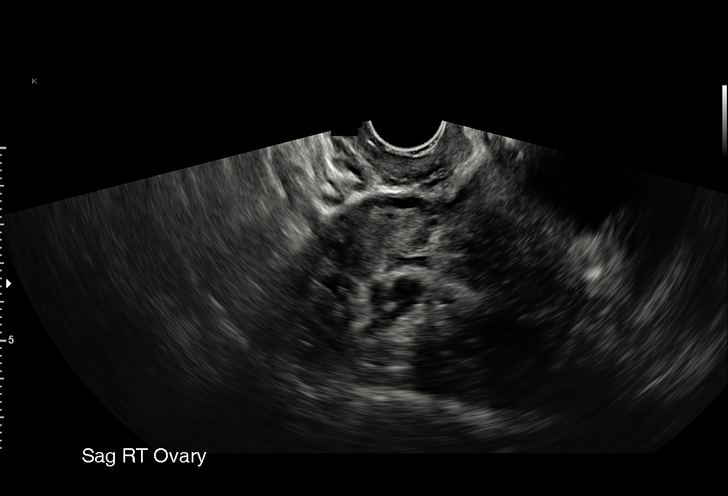
[im 55/55]
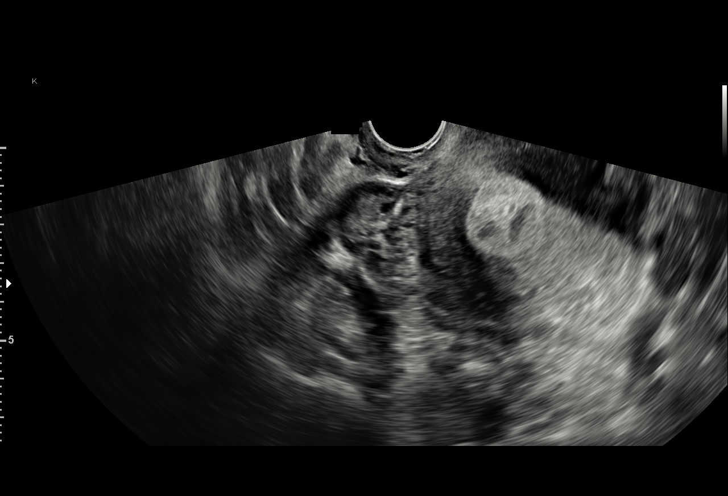

[15 of 28 positions shown; findings below may reference images not displayed]

FINDINGS: Intrauterine gestational sac: Tiny

Yolk sac:  Absent

Embryo:  Absent

MSD: 5.6 mm   5 w   2 d

Subchorionic hemorrhage:  None visualized.

Maternal uterus/adnexae: 3.1 cm cyst is noted within the left ovary.
Right ovary is within normal limits.
IMPRESSION: Probable early intrauterine gestational sac, but no yolk sac, fetal
pole, or cardiac activity yet visualized. Recommend follow-up
quantitative B-HCG levels and follow-up US in 14 days to assess
viability. This recommendation follows SRU consensus guidelines:
Diagnostic Criteria for Nonviable Pregnancy Early in the First
Trimester. N Engl J Med 9971; [DATE].

## 2021-09-15 ENCOUNTER — Ambulatory Visit
Admission: EM | Admit: 2021-09-15 | Discharge: 2021-09-15 | Disposition: A | Discharge status: Home / Self Care | Payer: Medicaid Other | Attending: Internal Medicine | Admitting: Internal Medicine

## 2021-09-15 DIAGNOSIS — K0889 Other specified disorders of teeth and supporting structures: Secondary | ICD-10-CM

## 2021-09-15 DIAGNOSIS — K047 Periapical abscess without sinus: Secondary | ICD-10-CM

## 2021-09-15 MED ORDER — IBUPROFEN 800 MG PO TABS: 800.0000 mg | ORAL_TABLET | Freq: Three times a day (TID) | ORAL | 0 refills | Status: AC | PRN

## 2021-09-15 MED ORDER — AMOXICILLIN-POT CLAVULANATE 875-125 MG PO TABS: 1.0000 | ORAL_TABLET | Freq: Two times a day (BID) | ORAL | 0 refills | Status: AC

## 2021-09-15 NOTE — ED Provider Notes (Signed)
[facility]    CSN: [id number] Arrival date & time: 09/15/21  1322      History   Chief Complaint Chief Complaint  Patient presents with   jaw selling    HPI Alice Crawford is a 31 y.o. female.   Patient presents with left lower dental pain and swelling that started yesterday.  Denies any injury to the area.  Patient reports history of dental infections due to a broken tooth.  Patient has not seen a dentist for evaluation.  Denies any fever, body aches, chills, purulent drainage.     Past Medical History:  Diagnosis Date   Medical history non-contributory     There are no problems to display for this patient.   Past Surgical History:  Procedure Laterality Date   ADENOIDECTOMY      OB History     Gravida  2   Para  1   Term  1   Preterm      AB      Living  1      SAB      IAB      Ectopic      Multiple      Live Births               Home Medications    Prior to Admission medications   Medication Sig Start Date End Date Taking? Authorizing Provider  amoxicillin-clavulanate (AUGMENTIN) 875-125 MG tablet Take 1 tablet by mouth every 12 (twelve) hours. 09/15/21  Yes Rosaura Bolon, Rolly Salter E, FNP  ibuprofen (ADVIL) 800 MG tablet Take 1 tablet (800 mg total) by mouth 3 (three) times daily as needed for mild pain. 09/15/21  Yes Desaree Downen, Acie Fredrickson, FNP    Family History Family History  Problem Relation Age of Onset   Hypertension Mother     Social History Social History   Tobacco Use   Smoking status: Never   Smokeless tobacco: Never  Vaping Use   Vaping Use: Never used  Substance Use Topics   Alcohol use: No   Drug use: No     Allergies   Patient has no known allergies.   Review of Systems Review of Systems Per HPI  Physical Exam Triage Vital Signs ED Triage Vitals  Enc Vitals Group     BP 09/15/21 1332 120/80     Pulse Rate 09/15/21 1332 70     Resp 09/15/21 1332 18     Temp 09/15/21 1332 98.3 F (36.8 C)      Temp Source 09/15/21 1332 Oral     SpO2 09/15/21 1332 98 %     Weight --      Height --      Head Circumference --      Peak Flow --      Pain Score 09/15/21 1331 0     Pain Loc --      Pain Edu? --      Excl. in GC? --    No data found.  Updated Vital Signs BP 120/80 (BP Location: Left Arm)   Pulse 70   Temp 98.3 F (36.8 C) (Oral)   Resp 18   SpO2 98%   Visual Acuity Right Eye Distance:   Left Eye Distance:   Bilateral Distance:    Right Eye Near:   Left Eye Near:    Bilateral Near:     Physical Exam Constitutional:      General: She is not in acute distress.  Appearance: Normal appearance. She is not toxic-appearing or diaphoretic.  HENT:     Head: Normocephalic and atraumatic.     Mouth/Throat:     Dentition: Dental tenderness and gingival swelling present.     Comments: Moderate swelling noted to outer portion of the left mouth as well as inner portion.  Dental tenderness to left lower dentition.  Also has gingival swelling and erythema to left lower dentition. Eyes:     Extraocular Movements: Extraocular movements intact.     Conjunctiva/sclera: Conjunctivae normal.  Pulmonary:     Effort: Pulmonary effort is normal.  Neurological:     General: No focal deficit present.     Mental Status: She is alert and oriented to person, place, and time. Mental status is at baseline.  Psychiatric:        Mood and Affect: Mood normal.        Behavior: Behavior normal.        Thought Content: Thought content normal.        Judgment: Judgment normal.      UC Treatments / Results  Labs (all labs ordered are listed, but only abnormal results are displayed) Labs Reviewed - No data to display  EKG   Radiology No results found.  Procedures Procedures (including critical care time)  Medications Ordered in UC Medications - No data to display  Initial Impression / Assessment and Plan / UC Course  I have reviewed the triage vital signs and the nursing  notes.  Pertinent labs & imaging results that were available during my care of the patient were reviewed by me and considered in my medical decision making (see chart for details).     Will treat dental infection with Augmentin antibiotic.  Patient was offered Toradol shot but declined.  Will prescribe ibuprofen for patient to take as needed.  Patient was advised to not take any additional NSAIDs while taking this ibuprofen.  Patient to follow-up with dentist for further evaluation and management.  Discussed return precautions.  Patient verbalized understanding and was agreeable with plan. Final Clinical Impressions(s) / UC Diagnoses   Final diagnoses:  Dental infection  Pain, dental     Discharge Instructions      You have a dental infection which is being treated with an antibiotic.  You have also been prescribed ibuprofen to take as needed.  Please do not take any additional ibuprofen, Advil, Aleve while taking this ibuprofen.  Also apply warm compresses to the side of your face.  Follow-up with dentist for further evaluation and management.    ED Prescriptions     Medication Sig Dispense Auth. Provider   amoxicillin-clavulanate (AUGMENTIN) 875-125 MG tablet Take 1 tablet by mouth every 12 (twelve) hours. 14 tablet Latham, Stonewall E, [place]   ibuprofen (ADVIL) 800 MG tablet Take 1 tablet (800 mg total) by mouth 3 (three) times daily as needed for mild pain. 21 tablet [place], Acie Fredrickson, [place]      PDMP not reviewed this encounter.   Gustavus Bryant, [place] 09/15/21 1344

## 2021-09-15 NOTE — Discharge Instructions (Signed)
You have a dental infection which is being treated with an antibiotic.  You have also been prescribed ibuprofen to take as needed.  Please do not take any additional ibuprofen, Advil, Aleve while taking this ibuprofen.  Also apply warm compresses to the side of your face.  Follow-up with dentist for further evaluation and management.

## 2021-09-15 NOTE — ED Triage Notes (Signed)
Pt c/o possible tooth abscess onset last night now severely swollen and painful

## 2022-10-30 ENCOUNTER — Encounter (HOSPITAL_COMMUNITY): Payer: Self-pay | Admitting: Emergency Medicine

## 2022-10-30 ENCOUNTER — Emergency Department (HOSPITAL_COMMUNITY)
Admission: EM | Admit: 2022-10-30 | Discharge: 2022-10-31 | Discharge status: Against Medical Advice | Payer: Medicaid Other | Attending: Emergency Medicine | Admitting: Emergency Medicine

## 2022-10-30 ENCOUNTER — Other Ambulatory Visit: Payer: Self-pay

## 2022-10-30 DIAGNOSIS — S70361A Insect bite (nonvenomous), right thigh, initial encounter: Secondary | ICD-10-CM | POA: Diagnosis not present

## 2022-10-30 DIAGNOSIS — Z5321 Procedure and treatment not carried out due to patient leaving prior to being seen by health care provider: Secondary | ICD-10-CM | POA: Diagnosis not present

## 2022-10-30 DIAGNOSIS — O9A219 Injury, poisoning and certain other consequences of external causes complicating pregnancy, unspecified trimester: Secondary | ICD-10-CM | POA: Insufficient documentation

## 2022-10-30 DIAGNOSIS — W57XXXA Bitten or stung by nonvenomous insect and other nonvenomous arthropods, initial encounter: Secondary | ICD-10-CM | POA: Diagnosis not present

## 2022-10-30 NOTE — ED Triage Notes (Signed)
Patient reports bee sting at right upper thigh yesterday with localized itching/swelling , she is 4 months pregnant , G3P1 , respirations unlabored , denies oral swelling or abdominal cramping .
